# Patient Record
Sex: Female | Born: 1940 | Race: White | Hispanic: No | Marital: Married
Health system: Southern US, Community
[De-identification: ages and names within clinical notes are randomized; demographics above are authoritative.]

---

## 1999-04-07 ENCOUNTER — Emergency Department (HOSPITAL_COMMUNITY): Admission: EM | Admit: 1999-04-07 | Discharge: 1999-04-07 | Payer: Self-pay | Admitting: Emergency Medicine

## 1999-08-07 ENCOUNTER — Ambulatory Visit (HOSPITAL_COMMUNITY): Admission: RE | Admit: 1999-08-07 | Discharge: 1999-08-07 | Payer: Self-pay | Admitting: Neurosurgery

## 1999-08-16 ENCOUNTER — Ambulatory Visit (HOSPITAL_COMMUNITY): Admission: RE | Admit: 1999-08-16 | Discharge: 1999-08-16 | Payer: Self-pay | Admitting: Neurosurgery

## 1999-08-16 ENCOUNTER — Encounter: Payer: Self-pay | Admitting: Neurosurgery

## 2004-04-25 ENCOUNTER — Encounter: Payer: Self-pay | Admitting: Rheumatology

## 2004-05-23 ENCOUNTER — Encounter: Payer: Self-pay | Admitting: Rheumatology

## 2005-01-11 ENCOUNTER — Ambulatory Visit: Payer: Self-pay | Admitting: Internal Medicine

## 2005-01-22 ENCOUNTER — Ambulatory Visit: Payer: Self-pay | Admitting: Internal Medicine

## 2006-03-06 ENCOUNTER — Ambulatory Visit: Payer: Self-pay | Admitting: Gastroenterology

## 2006-06-10 ENCOUNTER — Ambulatory Visit: Payer: Self-pay | Admitting: Ophthalmology

## 2006-06-16 ENCOUNTER — Ambulatory Visit: Payer: Self-pay | Admitting: Ophthalmology

## 2006-07-14 ENCOUNTER — Ambulatory Visit: Payer: Self-pay | Admitting: Ophthalmology

## 2006-07-21 ENCOUNTER — Ambulatory Visit: Payer: Self-pay | Admitting: Ophthalmology

## 2006-10-20 ENCOUNTER — Encounter: Payer: Self-pay | Admitting: Unknown Physician Specialty

## 2007-06-24 ENCOUNTER — Ambulatory Visit: Payer: Self-pay | Admitting: Internal Medicine

## 2007-08-04 ENCOUNTER — Ambulatory Visit: Payer: Self-pay | Admitting: Unknown Physician Specialty

## 2008-08-22 ENCOUNTER — Ambulatory Visit: Payer: Self-pay | Admitting: Rheumatology

## 2008-11-07 ENCOUNTER — Ambulatory Visit: Payer: Self-pay | Admitting: Specialist

## 2008-12-20 ENCOUNTER — Encounter: Payer: Self-pay | Admitting: Rheumatology

## 2008-12-23 ENCOUNTER — Encounter: Payer: Self-pay | Admitting: Rheumatology

## 2009-12-16 ENCOUNTER — Ambulatory Visit: Payer: Self-pay | Admitting: Neurosurgery

## 2010-06-06 ENCOUNTER — Ambulatory Visit (HOSPITAL_COMMUNITY)
Admission: RE | Admit: 2010-06-06 | Discharge: 2010-06-06 | Disposition: A | Discharge status: Home / Self Care | Payer: Medicare Other | Source: Ambulatory Visit | Attending: Neurosurgery | Admitting: Neurosurgery

## 2010-06-06 ENCOUNTER — Other Ambulatory Visit (HOSPITAL_COMMUNITY): Payer: Self-pay | Admitting: Neurosurgery

## 2010-06-06 ENCOUNTER — Encounter (HOSPITAL_COMMUNITY)
Admission: RE | Admit: 2010-06-06 | Discharge: 2010-06-06 | Disposition: A | Discharge status: Home / Self Care | Payer: Medicare Other | Source: Ambulatory Visit | Attending: Neurosurgery | Admitting: Neurosurgery

## 2010-06-06 DIAGNOSIS — Z01812 Encounter for preprocedural laboratory examination: Secondary | ICD-10-CM | POA: Insufficient documentation

## 2010-06-06 DIAGNOSIS — M5126 Other intervertebral disc displacement, lumbar region: Secondary | ICD-10-CM

## 2010-06-06 DIAGNOSIS — Z01810 Encounter for preprocedural cardiovascular examination: Secondary | ICD-10-CM | POA: Insufficient documentation

## 2010-06-06 DIAGNOSIS — Z01818 Encounter for other preprocedural examination: Secondary | ICD-10-CM | POA: Insufficient documentation

## 2010-06-06 LAB — DIFFERENTIAL
Basophils Absolute: 0.1 10*3/uL (ref 0.0–0.1)
Basophils Relative: 1 % (ref 0–1)
Eosinophils Absolute: 0.2 10*3/uL (ref 0.0–0.7)
Eosinophils Relative: 2 % (ref 0–5)
Lymphocytes Relative: 30 % (ref 12–46)
Lymphs Abs: 2.6 10*3/uL (ref 0.7–4.0)
Monocytes Absolute: 0.8 10*3/uL (ref 0.1–1.0)
Monocytes Relative: 9 % (ref 3–12)
Neutro Abs: 5.1 10*3/uL (ref 1.7–7.7)
Neutrophils Relative %: 58 % (ref 43–77)

## 2010-06-06 LAB — CBC
HCT: 40.7 % (ref 36.0–46.0)
Hemoglobin: 14.8 g/dL (ref 12.0–15.0)
MCH: 36.9 pg — ABNORMAL HIGH (ref 26.0–34.0)
MCHC: 36.4 g/dL — ABNORMAL HIGH (ref 30.0–36.0)
MCV: 101.5 fL — ABNORMAL HIGH (ref 78.0–100.0)
Platelets: 244 10*3/uL (ref 150–400)
RBC: 4.01 MIL/uL (ref 3.87–5.11)
RDW: 13.8 % (ref 11.5–15.5)
WBC: 8.8 10*3/uL (ref 4.0–10.5)

## 2010-06-06 LAB — APTT: aPTT: 29 seconds (ref 24–37)

## 2010-06-06 LAB — COMPREHENSIVE METABOLIC PANEL
ALT: 27 U/L (ref 0–35)
AST: 46 U/L — ABNORMAL HIGH (ref 0–37)
Albumin: 4.6 g/dL (ref 3.5–5.2)
CO2: 24 mEq/L (ref 19–32)
Chloride: 97 mEq/L (ref 96–112)
Creatinine, Ser: 0.58 mg/dL (ref 0.4–1.2)
GFR calc Af Amer: >60 mL/min (ref >60)
GFR calc non Af Amer: >60 mL/min (ref >60)
Sodium: 134 mEq/L — ABNORMAL LOW (ref 135–145)
Total Bilirubin: 0.9 mg/dL (ref 0.3–1.2)

## 2010-06-06 LAB — PROTIME-INR
INR: 0.86 (ref 0.00–1.49)
Prothrombin Time: 11.9 seconds (ref 11.6–15.2)

## 2010-06-06 LAB — TYPE AND SCREEN
ABO/RH(D): O POS
Antibody Screen: NEGATIVE

## 2010-06-06 LAB — ABO/RH: ABO/RH(D): O POS

## 2010-06-13 ENCOUNTER — Ambulatory Visit (HOSPITAL_COMMUNITY)
Admission: RE | Admit: 2010-06-13 | Discharge: 2010-06-16 | Disposition: A | Discharge status: Home Health | Payer: Medicare Other | Source: Ambulatory Visit | Attending: Neurosurgery | Admitting: Neurosurgery

## 2010-06-13 ENCOUNTER — Inpatient Hospital Stay (HOSPITAL_COMMUNITY): Payer: Medicare Other

## 2010-06-13 DIAGNOSIS — Y9210 Unspecified residential institution as the place of occurrence of the external cause: Secondary | ICD-10-CM | POA: Insufficient documentation

## 2010-06-13 DIAGNOSIS — M79609 Pain in unspecified limb: Secondary | ICD-10-CM | POA: Insufficient documentation

## 2010-06-13 DIAGNOSIS — F17200 Nicotine dependence, unspecified, uncomplicated: Secondary | ICD-10-CM | POA: Insufficient documentation

## 2010-06-13 DIAGNOSIS — J449 Chronic obstructive pulmonary disease, unspecified: Secondary | ICD-10-CM | POA: Insufficient documentation

## 2010-06-13 DIAGNOSIS — Z01812 Encounter for preprocedural laboratory examination: Secondary | ICD-10-CM | POA: Insufficient documentation

## 2010-06-13 DIAGNOSIS — IMO0002 Reserved for concepts with insufficient information to code with codable children: Secondary | ICD-10-CM | POA: Insufficient documentation

## 2010-06-13 DIAGNOSIS — F1010 Alcohol abuse, uncomplicated: Secondary | ICD-10-CM | POA: Insufficient documentation

## 2010-06-13 DIAGNOSIS — M48061 Spinal stenosis, lumbar region without neurogenic claudication: Secondary | ICD-10-CM | POA: Insufficient documentation

## 2010-06-13 DIAGNOSIS — M5126 Other intervertebral disc displacement, lumbar region: Secondary | ICD-10-CM | POA: Insufficient documentation

## 2010-06-13 DIAGNOSIS — Y834 Other reconstructive surgery as the cause of abnormal reaction of the patient, or of later complication, without mention of misadventure at the time of the procedure: Secondary | ICD-10-CM | POA: Insufficient documentation

## 2010-06-13 DIAGNOSIS — J4489 Other specified chronic obstructive pulmonary disease: Secondary | ICD-10-CM | POA: Insufficient documentation

## 2010-06-13 DIAGNOSIS — G834 Cauda equina syndrome: Secondary | ICD-10-CM | POA: Insufficient documentation

## 2010-06-13 LAB — CBC
HCT: 33.4 % — ABNORMAL LOW (ref 36.0–46.0)
Platelets: 177 10*3/uL (ref 150–400)
RBC: 3.24 MIL/uL — ABNORMAL LOW (ref 3.87–5.11)
RDW: 13.7 % (ref 11.5–15.5)
WBC: 7.2 10*3/uL (ref 4.0–10.5)

## 2010-06-13 LAB — PROTIME-INR: INR: 0.96 (ref 0.00–1.49)

## 2010-06-13 LAB — APTT: aPTT: 27 seconds (ref 24–37)

## 2010-06-20 ENCOUNTER — Emergency Department: Payer: Self-pay | Admitting: Emergency Medicine

## 2010-06-20 NOTE — Op Note (Signed)
NAMEDIETRICH, KE              ACCOUNT NO.:  [id number]  MEDICAL RECORD NO.:  [id number]           PATIENT TYPE:  I  LOCATION:  3039                         FACILITY:  [facility]  PHYSICIAN:  Kathaleen Maser. Davidjames Blansett, M.D.    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  PROCEDURE NAME:  Bilateral L2-L3 decompressive laminotomies and foraminotomies.  Bilateral L3-L4 decompressive laminotomies and foraminotomies.  Right L3-4 microdiskectomy.  SURGEON:  Kathaleen Maser. Quadarius Henton, MD  ASSISTANT:  Tia Alert, MD  ANESTHESIA:  General endotracheal.  PREMEDICATION:  Ms. Lacomb is a 70 year old female with history of severe bilateral lower extremity pain and neurogenic claudication failing conservative management.  Workup demonstrates evidence of severe stenosis at L2-3 and L3-4 complicated by a large right paracentral disk herniation at L3-4.  The patient presents now for decompression and microdiskectomy in hopes of improving her symptoms.  OPERATIVE NOTE:  The patient was brought to the operating room and placed in the operating table in supine position.  After adequate level of anesthesia was achieved, the patient was placed on prone onto the Wilson frame and appropriately padded.  The patient's lumbar region was prepped and draped sterilely.  A 10-blade was used to make a curvilinear skin incision overlying the L2, 3, and 4 levels.  This was carried down sharply in the midline.  Subperiosteal dissection was then performed exposing the lamina and facet joints of L2, L3, and L4 bilaterally. Deep self-retaining retractor was placed.  Intraoperative x-ray was used and levels were confirmed.  bilateral laminotomy was then  performed using a high-speed drill and Kerrison rongeurs to remove the inferior aspect of the lamina of L2, medial aspect of the L2-3 facet joints, and superior rim of the L3 lamina.  Procedure was then repeated on the contralateral side and repeated in a similar fashion at L3-4 bilaterally.  Ligamentum flavum was then elevated and resected in piecemeal fashion using Kerrison rongeurs.  The underlying thecal sac and exiting L2, L3, and L4 nerve roots were identified and widely decompressed.  Microscope was then brought into the field.  Using microdissection of  thecal sac and underlying disk herniation on the right-sided L3-4, thecal sac and L4 nerve root were gently mobilized, tracked towards the midline.  Disk herniation was readily apparent. This was then incised with 15 blade in a rectangular fashion.  Wide disk space clean-out was then achieved using pituitary rongeurs, upbiting pituitary rongeurs, and Epstein curettes.  All elements of disk herniation were completely resected.  All loose or obvious degenerative disk material was then removed from the interspace.  At this point, a very thorough diskectomy had been achieved.  There was no injury to thecal sac or nerve roots.  The wound was then irrigated with antibiotic solution.  Gelfoam was placed topically for hemostasis and found to be good.  Microscope and retractor system were removed.  Hemostasis was achieved with  electrocautery.  The wound was closed in layers with Vicryl suture.  Steri-Strips and sterile dressings were applied.  There were no complications.  The patient tolerated the procedure well, and she returned to the recovery room postoperatively.          ______________________________ Kathaleen Maser Thamara Leger, M.D.     HAP/MEDQ  D:  06/13/2010  T:  06/14/2010  Job:  [id number]  Electronically Signed by Julio Sicks M.D. on 06/20/2010 04:06:30 PM

## 2010-06-20 NOTE — Op Note (Signed)
  Doris Blanchard, Doris Blanchard              ACCOUNT NO.:  [id number]  MEDICAL RECORD NO.:  [id number]           PATIENT TYPE:  I  LOCATION:  3039                         FACILITY:  [facility]  PHYSICIAN:  Kathaleen Maser. Jala Dundon, M.D.    DATE OF BIRTH:  [birth year]  DATE OF PROCEDURE:  06/13/2010 DATE OF DISCHARGE:                              OPERATIVE REPORT   PREOPERATIVE DIAGNOSIS:  Postoperative epidural hematoma with cauda equina syndrome.  POSTOPERATIVE DIAGNOSIS:  Postoperative epidural hematoma with cauda equina syndrome.  PROCEDURE NAME:  Emergent re-exploration of lumbar wound with evacuation of postoperative epidural hematoma.  SURGEON:  Kathaleen Maser. Benaiah Behan, MD  ANESTHESIA:  General endotracheal.  INDICATIONS:  Doris Blanchard is a 70 year old female who is a few hours status post L3-4 and L2-3 decompressive laminotomies with foraminotomies.  Postoperatively, the patient had been doing well when she progressively began to develop back pain with radiation to her left lower extremity with accompanying numbness and paresthesias.  This rapidly progressed to bilateral numbness and paresthesias as well as acute paraparesis.  The patient was evaluated and found to be likely suffering from acute postoperative epidural hematoma at least in part secondary to platelet dysfunction associated with her chronic alcoholism.  I discussed situation with the patient and her family. Recommended emergent return to the operating room for wound re- exploration.  Risks and benefits were discussed and the patient and her family agreed to proceed.  OPERATIVE NOTE:  The patient was taken to operating room where she was placed under general anesthesia.  She was turned prone onto Wilson frame and properly padded.  Wound was prepped and draped sterilely.  The wound was then reopened using 10 blade and suture scissors.  A large amount of hematoma was readily apparent in the soft tissues.  Retractor was placed.  Deep hematoma  was also evacuated.  There was no signs of any active major bleeding, just a general venous ooze throughout.  The wound was copiously irrigated.  The spinal canal was inspected.  There was no evidence of active hemorrhage.  There was no evidence of ventral epidural hematoma in either L2-3 or L3-4.  Medium large Hemovac drains were then left in place in the epidural space bilaterally.  These were sutured into place.  Wound was then reapproximated with Vicryl sutures and Steri-Strips and sterile dressing were applied to the surface.  There were no apparent complications.  The patient tolerated the procedure well and she returned to the recovery room postoperatively.          ______________________________ Kathaleen Maser Jazion Atteberry, M.D.     HAP/MEDQ  D:  06/13/2010  T:  06/14/2010  Job:  [id number]  Electronically Signed by Julio Sicks M.D. on 06/20/2010 04:06:32 PM

## 2010-08-20 ENCOUNTER — Ambulatory Visit: Payer: Self-pay | Admitting: Neurosurgery

## 2010-09-18 ENCOUNTER — Ambulatory Visit: Payer: Self-pay | Admitting: Internal Medicine

## 2011-01-21 ENCOUNTER — Ambulatory Visit: Payer: Self-pay | Admitting: Internal Medicine

## 2011-01-24 ENCOUNTER — Ambulatory Visit: Payer: Self-pay | Admitting: Internal Medicine

## 2011-07-24 ENCOUNTER — Ambulatory Visit: Payer: Self-pay | Admitting: Oncology

## 2011-08-05 ENCOUNTER — Ambulatory Visit: Payer: Self-pay | Admitting: Surgery

## 2011-08-08 ENCOUNTER — Ambulatory Visit: Payer: Self-pay | Admitting: Specialist

## 2011-08-08 LAB — URINALYSIS, COMPLETE
Bilirubin,UR: NEGATIVE
Blood: NEGATIVE
Ketone: NEGATIVE
Leukocyte Esterase: NEGATIVE
Protein: 30
WBC UR: 2 /HPF (ref 0–5)

## 2011-08-08 LAB — CBC
HCT: 38.1 % (ref 35.0–47.0)
MCH: 36.3 pg — ABNORMAL HIGH (ref 26.0–34.0)
MCHC: 33.9 g/dL (ref 32.0–36.0)
RBC: 3.55 10*6/uL — ABNORMAL LOW (ref 3.80–5.20)

## 2011-08-08 LAB — COMPREHENSIVE METABOLIC PANEL
Anion Gap: 13 (ref 7–16)
BUN: 7 mg/dL (ref 7–18)
Calcium, Total: 8.3 mg/dL — ABNORMAL LOW (ref 8.5–10.1)
Chloride: 97 mmol/L — ABNORMAL LOW (ref 98–107)
Creatinine: 0.7 mg/dL (ref 0.60–1.30)
Glucose: 87 mg/dL (ref 65–99)
Sodium: 137 mmol/L (ref 136–145)
Total Protein: 7.9 g/dL (ref 6.4–8.2)

## 2011-08-08 LAB — DRUG SCREEN, URINE
Benzodiazepine, Ur Scrn: NEGATIVE (ref ?–200)
Cocaine Metabolite,Ur ~~LOC~~: NEGATIVE (ref ?–300)
MDMA (Ecstasy)Ur Screen: NEGATIVE (ref ?–500)
Methadone, Ur Screen: NEGATIVE (ref ?–300)
Opiate, Ur Screen: NEGATIVE (ref ?–300)
Tricyclic, Ur Screen: NEGATIVE (ref ?–1000)

## 2011-08-08 LAB — TSH: Thyroid Stimulating Horm: 1.79 u[IU]/mL

## 2011-08-08 LAB — ACETAMINOPHEN LEVEL: Acetaminophen: <2 ug/mL

## 2011-08-09 ENCOUNTER — Inpatient Hospital Stay: Payer: Self-pay | Admitting: Internal Medicine

## 2011-08-09 LAB — FOLATE: Folic Acid: 20 ng/mL (ref 3.1–100.0)

## 2011-08-10 LAB — BASIC METABOLIC PANEL
BUN: 11 mg/dL (ref 7–18)
Calcium, Total: 8.5 mg/dL (ref 8.5–10.1)
EGFR (African American): >60
EGFR (Non-African Amer.): >60
Glucose: 163 mg/dL — ABNORMAL HIGH (ref 65–99)
Potassium: 3.9 mmol/L (ref 3.5–5.1)

## 2011-08-10 LAB — CBC WITH DIFFERENTIAL/PLATELET
Basophil #: 0 10*3/uL (ref 0.0–0.1)
Basophil %: 0.5 %
Eosinophil #: 0 10*3/uL (ref 0.0–0.7)
Eosinophil %: 0 %
HCT: 34.5 % — ABNORMAL LOW (ref 35.0–47.0)
Lymphocyte #: 0.8 10*3/uL — ABNORMAL LOW (ref 1.0–3.6)
Lymphocyte %: 17.1 %
MCH: 36.2 pg — ABNORMAL HIGH (ref 26.0–34.0)
MCHC: 33.8 g/dL (ref 32.0–36.0)
Monocyte #: 0.7 x10 3/mm (ref 0.2–0.9)
Neutrophil %: 66.4 %
Platelet: 247 10*3/uL (ref 150–440)
WBC: 4.6 10*3/uL (ref 3.6–11.0)

## 2011-08-12 LAB — BASIC METABOLIC PANEL
Chloride: 97 mmol/L — ABNORMAL LOW (ref 98–107)
Co2: 32 mmol/L (ref 21–32)
Creatinine: 0.6 mg/dL (ref 0.60–1.30)
EGFR (African American): >60
EGFR (Non-African Amer.): >60
Osmolality: 275 (ref 275–301)

## 2011-08-12 LAB — CBC WITH DIFFERENTIAL/PLATELET
Basophil #: 0 10*3/uL (ref 0.0–0.1)
Eosinophil %: 2.1 %
HCT: 37.5 % (ref 35.0–47.0)
Lymphocyte #: 1.5 10*3/uL (ref 1.0–3.6)
MCH: 36.4 pg — ABNORMAL HIGH (ref 26.0–34.0)
MCV: 107 fL — ABNORMAL HIGH (ref 80–100)
WBC: 5.5 10*3/uL (ref 3.6–11.0)

## 2011-08-13 LAB — HEPATIC FUNCTION PANEL A (ARMC)
Alkaline Phosphatase: 63 U/L (ref 50–136)
Bilirubin, Direct: <0.1 mg/dL (ref 0.00–0.20)
Bilirubin,Total: 0.3 mg/dL (ref 0.2–1.0)

## 2011-08-14 LAB — CANCER ANTIGEN 27.29: CA 27.29: 31.2 U/mL (ref 0.0–38.6)

## 2011-08-14 LAB — CULTURE, BLOOD (SINGLE)

## 2011-08-24 ENCOUNTER — Ambulatory Visit: Payer: Self-pay | Admitting: Oncology

## 2011-12-16 ENCOUNTER — Ambulatory Visit: Payer: Self-pay | Admitting: Specialist

## 2011-12-24 ENCOUNTER — Inpatient Hospital Stay: Payer: Self-pay | Admitting: Psychiatry

## 2011-12-24 LAB — COMPREHENSIVE METABOLIC PANEL WITH GFR
Albumin: 3.1 g/dL — ABNORMAL LOW
Alkaline Phosphatase: 126 U/L
Anion Gap: 13
BUN: 5 mg/dL — ABNORMAL LOW
Bilirubin,Total: 0.4 mg/dL
Calcium, Total: 7.4 mg/dL — ABNORMAL LOW
Chloride: 96 mmol/L — ABNORMAL LOW
Co2: 27 mmol/L
Creatinine: 0.72 mg/dL
EGFR (African American): >60
EGFR (Non-African Amer.): >60
Glucose: 99 mg/dL
Osmolality: 269
Potassium: 3.6 mmol/L
SGOT(AST): 103 U/L — ABNORMAL HIGH
SGPT (ALT): 60 U/L
Sodium: 136 mmol/L
Total Protein: 6.7 g/dL

## 2011-12-24 LAB — DRUG SCREEN, URINE
Amphetamines, Ur Screen: NEGATIVE
Barbiturates, Ur Screen: NEGATIVE
Benzodiazepine, Ur Scrn: NEGATIVE
Cannabinoid 50 Ng, Ur ~~LOC~~: NEGATIVE
Cocaine Metabolite,Ur ~~LOC~~: NEGATIVE
MDMA (Ecstasy)Ur Screen: NEGATIVE
Methadone, Ur Screen: NEGATIVE
Opiate, Ur Screen: NEGATIVE
Phencyclidine (PCP) Ur S: NEGATIVE
Tricyclic, Ur Screen: NEGATIVE

## 2011-12-24 LAB — ACETAMINOPHEN LEVEL: Acetaminophen: <2 ug/mL

## 2011-12-24 LAB — SALICYLATE LEVEL: Salicylates, Serum: <1.7 mg/dL

## 2011-12-24 LAB — URINALYSIS, COMPLETE
Bacteria: NONE SEEN
Bilirubin,UR: NEGATIVE
Ph: 6 (ref 4.5–8.0)
RBC,UR: <1 /HPF (ref 0–5)
WBC UR: <1 /HPF (ref 0–5)

## 2011-12-24 LAB — ETHANOL
Ethanol %: 0.189 % — ABNORMAL HIGH (ref 0.000–0.080)
Ethanol: 189 mg/dL

## 2011-12-24 LAB — CBC
HCT: 34.5 % — ABNORMAL LOW
HGB: 11.7 g/dL — ABNORMAL LOW
MCH: 33.8 pg
MCHC: 33.7 g/dL
MCV: 100 fL
Platelet: 224 10*3/uL
RBC: 3.45 X10 6/mm 3 — ABNORMAL LOW
RDW: 14.9 % — ABNORMAL HIGH
WBC: 4.3 10*3/uL

## 2011-12-24 LAB — TSH: Thyroid Stimulating Horm: 2.05 u[IU]/mL

## 2011-12-26 LAB — COMPREHENSIVE METABOLIC PANEL
Alkaline Phosphatase: 106 U/L (ref 50–136)
Anion Gap: 8 (ref 7–16)
Calcium, Total: 8 mg/dL — ABNORMAL LOW (ref 8.5–10.1)
Chloride: 95 mmol/L — ABNORMAL LOW (ref 98–107)
Co2: 30 mmol/L (ref 21–32)
EGFR (African American): >60
EGFR (Non-African Amer.): >60
Glucose: 116 mg/dL — ABNORMAL HIGH (ref 65–99)
Osmolality: 265 (ref 275–301)
Potassium: 4.2 mmol/L (ref 3.5–5.1)

## 2012-01-22 ENCOUNTER — Ambulatory Visit: Payer: Self-pay | Admitting: Surgery

## 2012-01-31 ENCOUNTER — Ambulatory Visit: Payer: Self-pay | Admitting: Rheumatology

## 2012-04-24 ENCOUNTER — Ambulatory Visit: Payer: Self-pay | Admitting: Unknown Physician Specialty

## 2012-05-03 ENCOUNTER — Inpatient Hospital Stay: Payer: Self-pay | Admitting: Internal Medicine

## 2012-05-03 LAB — COMPREHENSIVE METABOLIC PANEL
Albumin: 3.7 g/dL (ref 3.4–5.0)
Alkaline Phosphatase: 155 U/L — ABNORMAL HIGH (ref 50–136)
Bilirubin,Total: 1 mg/dL (ref 0.2–1.0)
Calcium, Total: 8.6 mg/dL (ref 8.5–10.1)
Chloride: 94 mmol/L — ABNORMAL LOW (ref 98–107)
Co2: 25 mmol/L (ref 21–32)
EGFR (Non-African Amer.): >60
Glucose: 73 mg/dL (ref 65–99)
Osmolality: 264 (ref 275–301)
Potassium: 3.7 mmol/L (ref 3.5–5.1)
SGOT(AST): 459 U/L — ABNORMAL HIGH (ref 15–37)
SGPT (ALT): 213 U/L — ABNORMAL HIGH (ref 12–78)
Sodium: 134 mmol/L — ABNORMAL LOW (ref 136–145)
Total Protein: 7.3 g/dL (ref 6.4–8.2)

## 2012-05-03 LAB — URINALYSIS, COMPLETE
Blood: NEGATIVE
Glucose,UR: NEGATIVE mg/dL (ref 0–75)
Nitrite: NEGATIVE
Ph: 6 (ref 4.5–8.0)
RBC,UR: 4 /HPF (ref 0–5)
WBC UR: 19 /HPF (ref 0–5)

## 2012-05-03 LAB — CBC
HGB: 10.3 g/dL — ABNORMAL LOW (ref 12.0–16.0)
MCH: 34.2 pg — ABNORMAL HIGH (ref 26.0–34.0)
Platelet: 181 10*3/uL (ref 150–440)
RDW: 17.9 % — ABNORMAL HIGH (ref 11.5–14.5)
WBC: 3.7 10*3/uL (ref 3.6–11.0)

## 2012-05-03 LAB — CK TOTAL AND CKMB (NOT AT ARMC): CK, Total: 51 U/L (ref 21–215)

## 2012-05-03 LAB — TROPONIN I: Troponin-I: <0.02 ng/mL

## 2012-05-04 LAB — LIPASE, BLOOD: Lipase: 680 U/L — ABNORMAL HIGH (ref 73–393)

## 2012-05-05 LAB — CBC WITH DIFFERENTIAL/PLATELET
Basophil #: 0.1 10*3/uL (ref 0.0–0.1)
Basophil %: 1.3 %
Eosinophil #: 0 10*3/uL (ref 0.0–0.7)
Eosinophil %: 0.6 %
HCT: 27.5 % — ABNORMAL LOW (ref 35.0–47.0)
MCHC: 34.1 g/dL (ref 32.0–36.0)
MCV: 104 fL — ABNORMAL HIGH (ref 80–100)
Monocyte #: 0.3 x10 3/mm (ref 0.2–0.9)
Monocyte %: 7.7 %
Neutrophil #: 3 10*3/uL (ref 1.4–6.5)
Platelet: 143 10*3/uL — ABNORMAL LOW (ref 150–440)
RDW: 17 % — ABNORMAL HIGH (ref 11.5–14.5)

## 2012-05-05 LAB — BASIC METABOLIC PANEL
Anion Gap: 16 (ref 7–16)
BUN: 3 mg/dL — ABNORMAL LOW (ref 7–18)
Calcium, Total: 7.5 mg/dL — ABNORMAL LOW (ref 8.5–10.1)
Chloride: 93 mmol/L — ABNORMAL LOW (ref 98–107)
Co2: 23 mmol/L (ref 21–32)
Glucose: 139 mg/dL — ABNORMAL HIGH (ref 65–99)
Osmolality: 263 (ref 275–301)
Sodium: 132 mmol/L — ABNORMAL LOW (ref 136–145)

## 2012-05-05 LAB — HEPATIC FUNCTION PANEL A (ARMC)
Alkaline Phosphatase: 138 U/L — ABNORMAL HIGH (ref 50–136)
Bilirubin, Direct: 0.7 mg/dL — ABNORMAL HIGH (ref 0.00–0.20)
Bilirubin,Total: 1.2 mg/dL — ABNORMAL HIGH (ref 0.2–1.0)
SGOT(AST): 281 U/L — ABNORMAL HIGH (ref 15–37)
SGPT (ALT): 156 U/L — ABNORMAL HIGH (ref 12–78)
Total Protein: 6.5 g/dL (ref 6.4–8.2)

## 2012-05-05 LAB — LIPASE, BLOOD: Lipase: 407 U/L — ABNORMAL HIGH (ref 73–393)

## 2012-05-06 LAB — CBC WITH DIFFERENTIAL/PLATELET
Basophil #: 0 10*3/uL (ref 0.0–0.1)
Eosinophil #: 0 10*3/uL (ref 0.0–0.7)
Eosinophil %: 0.1 %
HGB: 9.1 g/dL — ABNORMAL LOW (ref 12.0–16.0)
MCHC: 32.1 g/dL (ref 32.0–36.0)
Neutrophil #: 2.7 10*3/uL (ref 1.4–6.5)
Platelet: 165 10*3/uL (ref 150–440)
RBC: 2.72 10*6/uL — ABNORMAL LOW (ref 3.80–5.20)
WBC: 3.6 10*3/uL (ref 3.6–11.0)

## 2012-05-06 LAB — BASIC METABOLIC PANEL
Anion Gap: 13 (ref 7–16)
BUN: 5 mg/dL — ABNORMAL LOW (ref 7–18)
Chloride: 90 mmol/L — ABNORMAL LOW (ref 98–107)
Co2: 25 mmol/L (ref 21–32)
EGFR (African American): >60
Osmolality: 256 (ref 275–301)
Potassium: 3.7 mmol/L (ref 3.5–5.1)
Sodium: 128 mmol/L — ABNORMAL LOW (ref 136–145)

## 2012-05-06 LAB — HEPATIC FUNCTION PANEL A (ARMC)
Albumin: 3.3 g/dL — ABNORMAL LOW (ref 3.4–5.0)
Alkaline Phosphatase: 139 U/L — ABNORMAL HIGH (ref 50–136)
SGOT(AST): 280 U/L — ABNORMAL HIGH (ref 15–37)
Total Protein: 6.9 g/dL (ref 6.4–8.2)

## 2012-05-07 LAB — CBC WITH DIFFERENTIAL/PLATELET
Basophil #: 0 10*3/uL (ref 0.0–0.1)
Eosinophil #: 0 10*3/uL (ref 0.0–0.7)
HCT: 27.6 % — ABNORMAL LOW (ref 35.0–47.0)
HGB: 9.1 g/dL — ABNORMAL LOW (ref 12.0–16.0)
Lymphocyte #: 0.4 10*3/uL — ABNORMAL LOW (ref 1.0–3.6)
MCH: 34.2 pg — ABNORMAL HIGH (ref 26.0–34.0)
Monocyte %: 13.4 %
Neutrophil #: 2.6 10*3/uL (ref 1.4–6.5)
Neutrophil %: 73.7 %
Platelet: 198 10*3/uL (ref 150–440)
RBC: 2.66 10*6/uL — ABNORMAL LOW (ref 3.80–5.20)
WBC: 3.5 10*3/uL — ABNORMAL LOW (ref 3.6–11.0)

## 2012-05-07 LAB — BASIC METABOLIC PANEL
Anion Gap: 14 (ref 7–16)
Co2: 25 mmol/L (ref 21–32)

## 2012-05-08 LAB — CBC WITH DIFFERENTIAL/PLATELET
Basophil %: 0.2 %
Eosinophil #: 0 10*3/uL (ref 0.0–0.7)
Eosinophil %: 0 %
HCT: 28 % — ABNORMAL LOW (ref 35.0–47.0)
Lymphocyte #: 0.4 10*3/uL — ABNORMAL LOW (ref 1.0–3.6)
MCV: 103 fL — ABNORMAL HIGH (ref 80–100)
Monocyte #: 0.7 x10 3/mm (ref 0.2–0.9)
Monocyte %: 17.4 %
Neutrophil #: 2.9 10*3/uL (ref 1.4–6.5)
Neutrophil %: 71.4 %
RDW: 17.1 % — ABNORMAL HIGH (ref 11.5–14.5)
WBC: 4 10*3/uL (ref 3.6–11.0)

## 2012-05-08 LAB — BASIC METABOLIC PANEL
Calcium, Total: 8.1 mg/dL — ABNORMAL LOW (ref 8.5–10.1)
Co2: 25 mmol/L (ref 21–32)
EGFR (African American): >60
Osmolality: 260 (ref 275–301)

## 2012-05-08 LAB — HEPATIC FUNCTION PANEL A (ARMC)
Albumin: 3.3 g/dL — ABNORMAL LOW (ref 3.4–5.0)
Bilirubin, Direct: 0.8 mg/dL — ABNORMAL HIGH (ref 0.00–0.20)
Bilirubin,Total: 1.3 mg/dL — ABNORMAL HIGH (ref 0.2–1.0)
SGOT(AST): 170 U/L — ABNORMAL HIGH (ref 15–37)
SGPT (ALT): 139 U/L — ABNORMAL HIGH (ref 12–78)

## 2012-05-09 LAB — BASIC METABOLIC PANEL
Anion Gap: 11 (ref 7–16)
Chloride: 95 mmol/L — ABNORMAL LOW (ref 98–107)
Creatinine: 0.47 mg/dL — ABNORMAL LOW (ref 0.60–1.30)
Osmolality: 264 (ref 275–301)
Potassium: 3.3 mmol/L — ABNORMAL LOW (ref 3.5–5.1)
Sodium: 131 mmol/L — ABNORMAL LOW (ref 136–145)

## 2012-05-09 LAB — CBC WITH DIFFERENTIAL/PLATELET
Basophil #: 0 10*3/uL (ref 0.0–0.1)
Basophil %: 0 %
Eosinophil #: 0 10*3/uL (ref 0.0–0.7)
HCT: 26.9 % — ABNORMAL LOW (ref 35.0–47.0)
HGB: 9 g/dL — ABNORMAL LOW (ref 12.0–16.0)
MCH: 34.5 pg — ABNORMAL HIGH (ref 26.0–34.0)
Monocyte %: 19.6 %
Neutrophil #: 2.4 10*3/uL (ref 1.4–6.5)
Platelet: 250 10*3/uL (ref 150–440)
RBC: 2.62 10*6/uL — ABNORMAL LOW (ref 3.80–5.20)
RDW: 17.6 % — ABNORMAL HIGH (ref 11.5–14.5)

## 2012-05-09 LAB — MAGNESIUM: Magnesium: 1.1 mg/dL — ABNORMAL LOW

## 2012-05-10 LAB — CBC WITH DIFFERENTIAL/PLATELET
Basophil %: 0.2 %
Eosinophil #: 0 10*3/uL (ref 0.0–0.7)
Eosinophil %: 0.2 %
HCT: 26.7 % — ABNORMAL LOW (ref 35.0–47.0)
HGB: 8.5 g/dL — ABNORMAL LOW (ref 12.0–16.0)
Lymphocyte #: 0.9 10*3/uL — ABNORMAL LOW (ref 1.0–3.6)
Lymphocyte %: 21.1 %
MCHC: 31.8 g/dL — ABNORMAL LOW (ref 32.0–36.0)
MCV: 102 fL — ABNORMAL HIGH (ref 80–100)
Monocyte #: 1 x10 3/mm — ABNORMAL HIGH (ref 0.2–0.9)
Monocyte %: 23.2 %
Neutrophil %: 55.3 %
RDW: 17.9 % — ABNORMAL HIGH (ref 11.5–14.5)

## 2012-05-10 LAB — BASIC METABOLIC PANEL
BUN: 9 mg/dL (ref 7–18)
Calcium, Total: 7.7 mg/dL — ABNORMAL LOW (ref 8.5–10.1)
Chloride: 95 mmol/L — ABNORMAL LOW (ref 98–107)
Co2: 27 mmol/L (ref 21–32)
Creatinine: 0.63 mg/dL (ref 0.60–1.30)
EGFR (African American): >60
Osmolality: 260 (ref 275–301)
Sodium: 130 mmol/L — ABNORMAL LOW (ref 136–145)

## 2012-05-10 LAB — MAGNESIUM: Magnesium: 1.9 mg/dL

## 2012-05-11 LAB — BASIC METABOLIC PANEL
Anion Gap: 11 (ref 7–16)
BUN: 9 mg/dL (ref 7–18)
Creatinine: 0.58 mg/dL — ABNORMAL LOW (ref 0.60–1.30)
EGFR (African American): >60
Glucose: 90 mg/dL (ref 65–99)
Osmolality: 253 (ref 275–301)
Sodium: 127 mmol/L — ABNORMAL LOW (ref 136–145)

## 2012-05-11 LAB — CBC WITH DIFFERENTIAL/PLATELET
Basophil %: 0.1 %
Eosinophil #: 0 10*3/uL (ref 0.0–0.7)
HCT: 29.6 % — ABNORMAL LOW (ref 35.0–47.0)
Lymphocyte #: 1.2 10*3/uL (ref 1.0–3.6)
Lymphocyte %: 24.7 %
MCHC: 33 g/dL (ref 32.0–36.0)
Monocyte #: 1 x10 3/mm — ABNORMAL HIGH (ref 0.2–0.9)
Neutrophil %: 53.9 %
Platelet: 346 10*3/uL (ref 150–440)
RBC: 2.91 10*6/uL — ABNORMAL LOW (ref 3.80–5.20)
RDW: 17.9 % — ABNORMAL HIGH (ref 11.5–14.5)

## 2012-05-11 LAB — HEPATIC FUNCTION PANEL A (ARMC)
Albumin: 3.3 g/dL — ABNORMAL LOW (ref 3.4–5.0)
Alkaline Phosphatase: 114 U/L (ref 50–136)
Bilirubin, Direct: 0.6 mg/dL — ABNORMAL HIGH (ref 0.00–0.20)
Bilirubin,Total: 1.2 mg/dL — ABNORMAL HIGH (ref 0.2–1.0)
Total Protein: 6.4 g/dL (ref 6.4–8.2)

## 2012-05-13 LAB — CBC WITH DIFFERENTIAL/PLATELET
Basophil #: 0 10*3/uL (ref 0.0–0.1)
Basophil %: 0.1 %
Eosinophil %: 0.9 %
HCT: 29.6 % — ABNORMAL LOW (ref 35.0–47.0)
HGB: 9.9 g/dL — ABNORMAL LOW (ref 12.0–16.0)
Lymphocyte #: 1.3 10*3/uL (ref 1.0–3.6)
Lymphocyte %: 24.7 %
MCH: 33.3 pg (ref 26.0–34.0)
Monocyte #: 1.1 x10 3/mm — ABNORMAL HIGH (ref 0.2–0.9)
Monocyte %: 21.7 %
Platelet: 395 10*3/uL (ref 150–440)
RBC: 2.96 10*6/uL — ABNORMAL LOW (ref 3.80–5.20)
RDW: 18.5 % — ABNORMAL HIGH (ref 11.5–14.5)
WBC: 5.2 10*3/uL (ref 3.6–11.0)

## 2012-05-13 LAB — BASIC METABOLIC PANEL WITH GFR
Anion Gap: 10 (ref 7–16)
BUN: 12 mg/dL (ref 7–18)
Calcium, Total: 9.4 mg/dL (ref 8.5–10.1)
Chloride: 88 mmol/L — ABNORMAL LOW (ref 98–107)
Co2: 25 mmol/L (ref 21–32)
Creatinine: 0.73 mg/dL (ref 0.60–1.30)
EGFR (African American): >60
EGFR (Non-African Amer.): >60
Glucose: 139 mg/dL — ABNORMAL HIGH (ref 65–99)
Osmolality: 250 (ref 275–301)
Potassium: 4.2 mmol/L (ref 3.5–5.1)
Sodium: 123 mmol/L — ABNORMAL LOW (ref 136–145)

## 2012-05-13 LAB — HEPATIC FUNCTION PANEL A (ARMC)
Albumin: 3.2 g/dL — ABNORMAL LOW (ref 3.4–5.0)
Bilirubin, Direct: 0.4 mg/dL — ABNORMAL HIGH (ref 0.00–0.20)
Bilirubin,Total: 1.1 mg/dL — ABNORMAL HIGH (ref 0.2–1.0)
Total Protein: 6.3 g/dL — ABNORMAL LOW (ref 6.4–8.2)

## 2012-05-13 LAB — BASIC METABOLIC PANEL
Anion Gap: 12 (ref 7–16)
Calcium, Total: 8.9 mg/dL (ref 8.5–10.1)
Chloride: 87 mmol/L — ABNORMAL LOW (ref 98–107)
Co2: 25 mmol/L (ref 21–32)
Glucose: 86 mg/dL (ref 65–99)
Osmolality: 248 (ref 275–301)
Potassium: 3.7 mmol/L (ref 3.5–5.1)
Sodium: 124 mmol/L — ABNORMAL LOW (ref 136–145)

## 2012-05-14 LAB — CBC WITH DIFFERENTIAL/PLATELET
Basophil #: 0 10*3/uL (ref 0.0–0.1)
Basophil %: 0.3 %
Eosinophil %: 0.7 %
Lymphocyte #: 1.4 10*3/uL (ref 1.0–3.6)
Lymphocyte %: 23.2 %
MCH: 33 pg (ref 26.0–34.0)
Neutrophil #: 3.4 10*3/uL (ref 1.4–6.5)
Neutrophil %: 56.5 %
RBC: 2.89 10*6/uL — ABNORMAL LOW (ref 3.80–5.20)
RDW: 18.6 % — ABNORMAL HIGH (ref 11.5–14.5)
WBC: 6.1 10*3/uL (ref 3.6–11.0)

## 2012-05-14 LAB — BASIC METABOLIC PANEL
Calcium, Total: 9.1 mg/dL (ref 8.5–10.1)
Co2: 23 mmol/L (ref 21–32)
EGFR (African American): >60
EGFR (Non-African Amer.): >60
Glucose: 83 mg/dL (ref 65–99)
Osmolality: 253 (ref 275–301)
Potassium: 3.8 mmol/L (ref 3.5–5.1)
Sodium: 126 mmol/L — ABNORMAL LOW (ref 136–145)

## 2012-05-14 LAB — OSMOLALITY: Osmolality: 262 mOsm/kg — ABNORMAL LOW (ref 280–301)

## 2012-05-14 LAB — OSMOLALITY, URINE: Osmolality: 220 mOsm/kg

## 2012-05-15 LAB — CBC WITH DIFFERENTIAL/PLATELET
Basophil %: 0.3 %
Eosinophil #: 0 10*3/uL (ref 0.0–0.7)
Eosinophil %: 0.7 %
HCT: 27.4 % — ABNORMAL LOW (ref 35.0–47.0)
HGB: 9.1 g/dL — ABNORMAL LOW (ref 12.0–16.0)
Lymphocyte #: 1.4 10*3/uL (ref 1.0–3.6)
MCH: 33.3 pg (ref 26.0–34.0)
MCHC: 33.2 g/dL (ref 32.0–36.0)
MCV: 100 fL (ref 80–100)
Monocyte %: 16 %
Neutrophil #: 4.3 10*3/uL (ref 1.4–6.5)
Platelet: 366 10*3/uL (ref 150–440)
RBC: 2.73 10*6/uL — ABNORMAL LOW (ref 3.80–5.20)

## 2012-05-15 LAB — BASIC METABOLIC PANEL
Anion Gap: 8 (ref 7–16)
BUN: 14 mg/dL (ref 7–18)
Calcium, Total: 9 mg/dL (ref 8.5–10.1)
Co2: 24 mmol/L (ref 21–32)
Creatinine: 0.79 mg/dL (ref 0.60–1.30)
EGFR (African American): >60
EGFR (Non-African Amer.): >60
Sodium: 129 mmol/L — ABNORMAL LOW (ref 136–145)

## 2012-05-16 LAB — CBC WITH DIFFERENTIAL/PLATELET
Basophil #: 0 10*3/uL (ref 0.0–0.1)
Basophil %: 0.4 %
Eosinophil #: 0.1 10*3/uL (ref 0.0–0.7)
Eosinophil %: 1.1 %
HGB: 9.2 g/dL — ABNORMAL LOW (ref 12.0–16.0)
Lymphocyte %: 22.7 %
MCH: 35.7 pg — ABNORMAL HIGH (ref 26.0–34.0)
Monocyte #: 0.9 x10 3/mm (ref 0.2–0.9)
Monocyte %: 11.9 %
Neutrophil #: 4.8 10*3/uL (ref 1.4–6.5)
Platelet: 338 10*3/uL (ref 150–440)
RDW: 18.1 % — ABNORMAL HIGH (ref 11.5–14.5)
WBC: 7.5 10*3/uL (ref 3.6–11.0)

## 2012-05-16 LAB — BASIC METABOLIC PANEL
Anion Gap: 11 (ref 7–16)
Calcium, Total: 8.6 mg/dL (ref 8.5–10.1)
Chloride: 98 mmol/L (ref 98–107)
Co2: 24 mmol/L (ref 21–32)
Creatinine: 0.77 mg/dL (ref 0.60–1.30)
EGFR (Non-African Amer.): >60
Sodium: 133 mmol/L — ABNORMAL LOW (ref 136–145)

## 2012-05-16 LAB — HEPATIC FUNCTION PANEL A (ARMC)
Albumin: 2.8 g/dL — ABNORMAL LOW (ref 3.4–5.0)
Alkaline Phosphatase: 94 U/L (ref 50–136)
Bilirubin,Total: 0.6 mg/dL (ref 0.2–1.0)
SGPT (ALT): 91 U/L — ABNORMAL HIGH (ref 12–78)
Total Protein: 5.5 g/dL — ABNORMAL LOW (ref 6.4–8.2)

## 2012-05-17 LAB — COMPREHENSIVE METABOLIC PANEL
Alkaline Phosphatase: 105 U/L (ref 50–136)
Bilirubin,Total: 0.5 mg/dL (ref 0.2–1.0)
Chloride: 93 mmol/L — ABNORMAL LOW (ref 98–107)
Co2: 24 mmol/L (ref 21–32)
EGFR (African American): >60
EGFR (Non-African Amer.): >60
Glucose: 107 mg/dL — ABNORMAL HIGH (ref 65–99)
Osmolality: 258 (ref 275–301)
Potassium: 3.8 mmol/L (ref 3.5–5.1)
Sodium: 128 mmol/L — ABNORMAL LOW (ref 136–145)

## 2012-05-17 LAB — CBC WITH DIFFERENTIAL/PLATELET
Basophil %: 0.6 %
Eosinophil #: 0.1 10*3/uL (ref 0.0–0.7)
Eosinophil %: 1.1 %
HCT: 27.2 % — ABNORMAL LOW (ref 35.0–47.0)
MCH: 33.2 pg (ref 26.0–34.0)
MCHC: 33.1 g/dL (ref 32.0–36.0)
Monocyte #: 0.8 x10 3/mm (ref 0.2–0.9)
Monocyte %: 9.7 %
Neutrophil #: 5.8 10*3/uL (ref 1.4–6.5)
Neutrophil %: 69.2 %
Platelet: 327 10*3/uL (ref 150–440)
WBC: 8.3 10*3/uL (ref 3.6–11.0)

## 2012-05-17 LAB — LIPASE, BLOOD: Lipase: 246 U/L (ref 73–393)

## 2012-05-18 LAB — CBC WITH DIFFERENTIAL/PLATELET
Basophil %: 0.7 %
Eosinophil #: 0.1 10*3/uL (ref 0.0–0.7)
Eosinophil %: 0.9 %
Lymphocyte %: 22.1 %
MCH: 32.7 pg (ref 26.0–34.0)
Monocyte #: 0.9 x10 3/mm (ref 0.2–0.9)
Monocyte %: 10 %
Neutrophil #: 6.1 10*3/uL (ref 1.4–6.5)
Neutrophil %: 66.3 %
Platelet: 323 10*3/uL (ref 150–440)
RBC: 2.76 10*6/uL — ABNORMAL LOW (ref 3.80–5.20)
RDW: 18.7 % — ABNORMAL HIGH (ref 11.5–14.5)

## 2012-05-18 LAB — COMPREHENSIVE METABOLIC PANEL
Alkaline Phosphatase: 98 U/L (ref 50–136)
Anion Gap: 9 (ref 7–16)
BUN: 11 mg/dL (ref 7–18)
Creatinine: 0.67 mg/dL (ref 0.60–1.30)
EGFR (Non-African Amer.): >60
SGPT (ALT): 80 U/L — ABNORMAL HIGH (ref 12–78)
Sodium: 129 mmol/L — ABNORMAL LOW (ref 136–145)
Total Protein: 5.7 g/dL — ABNORMAL LOW (ref 6.4–8.2)

## 2012-08-10 ENCOUNTER — Ambulatory Visit: Payer: Self-pay | Admitting: Surgery

## 2012-08-11 ENCOUNTER — Ambulatory Visit: Payer: Self-pay

## 2013-05-14 IMAGING — CT CT CHEST W/ CM
1 series · 14 of 32 positions shown, 18 images · non-contrast
Comparison: none

REASON FOR EXAM: FU pulmonary nodules
COMMENTS:

[Series 3: soft tissue · axial · 0.65mm/px · z∈[-630,-336]mm · 14 of 116 slices shown, 18 images]
[im 9/116  mediastinal]
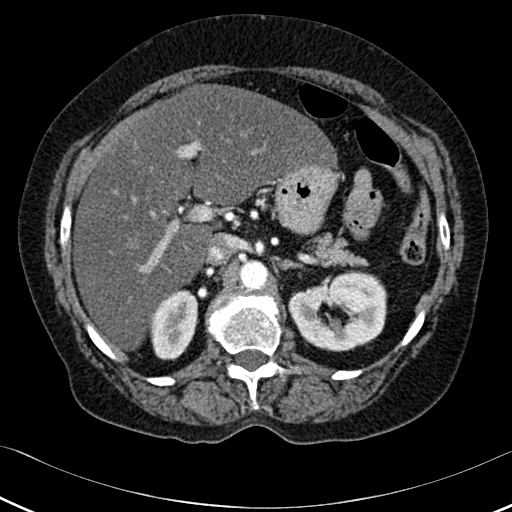
[im 9/116  lung]
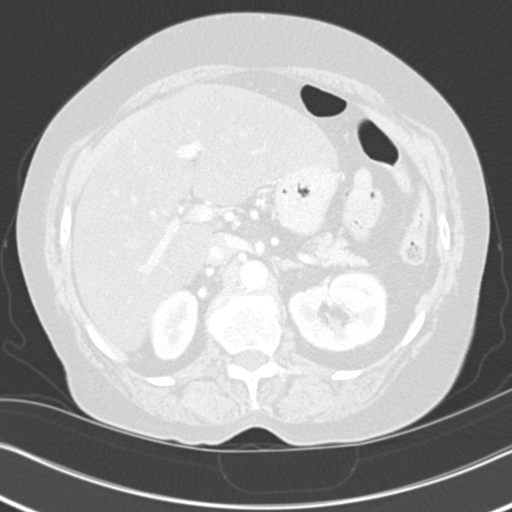
[im 18/116  lung]
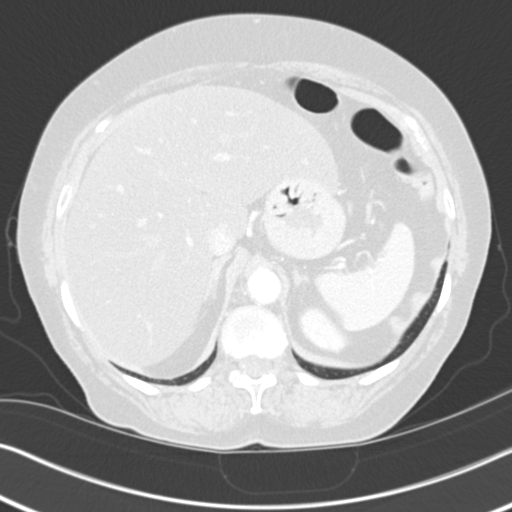
[im 24/116  lung]
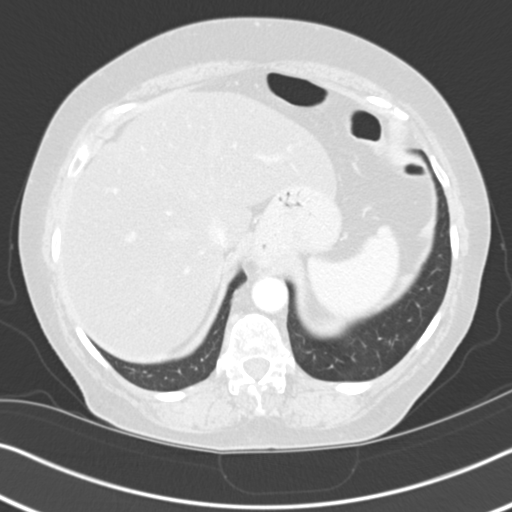
[im 30/116  lung]
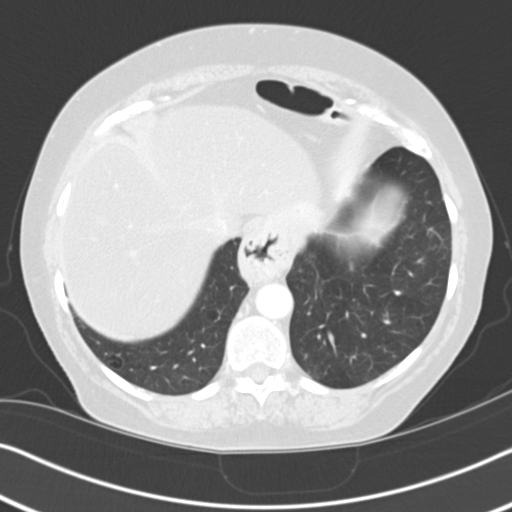
[im 39/116  mediastinal]
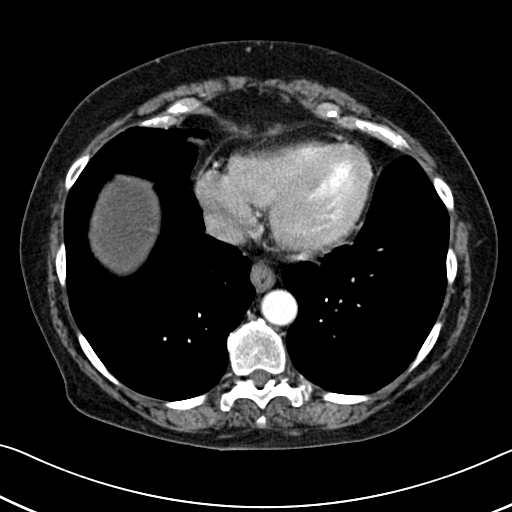
[im 39/116  lung]
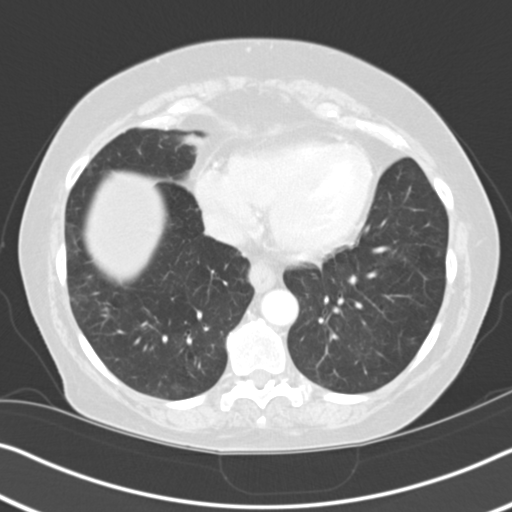
[im 47/116  lung]
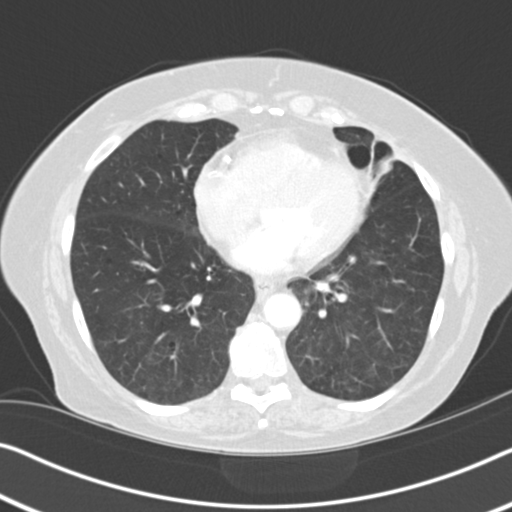
[im 56/116  lung]
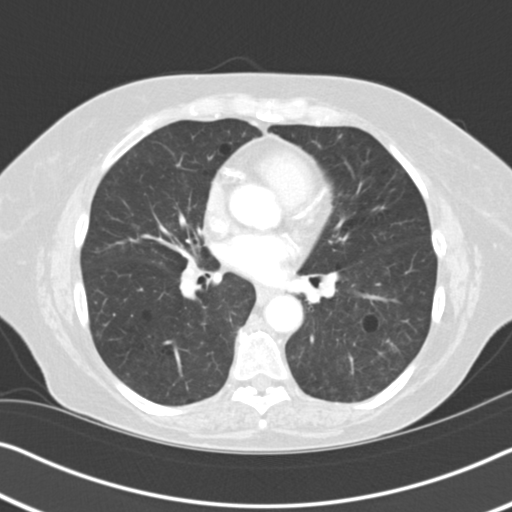
[im 64/116  lung]
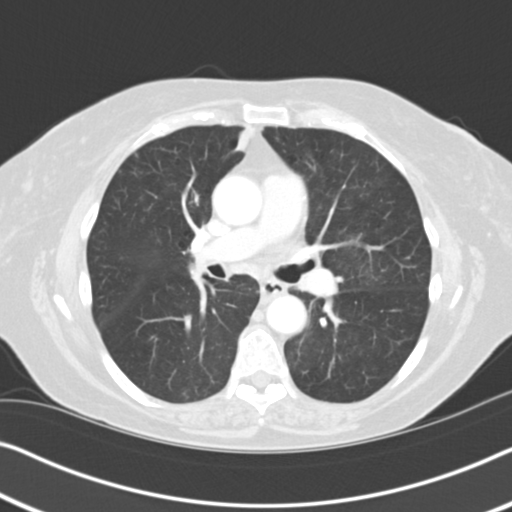
[im 70/116  mediastinal]
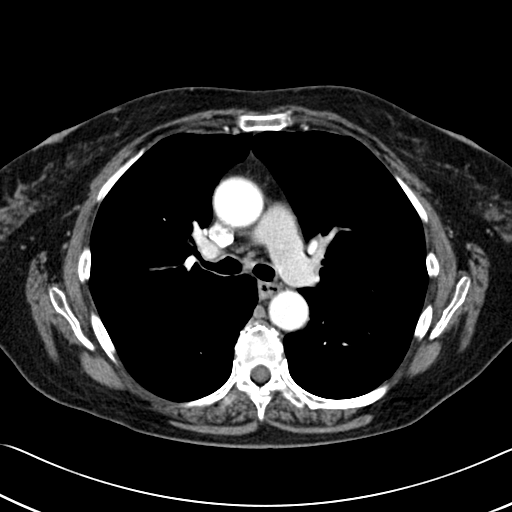
[im 70/116  lung]
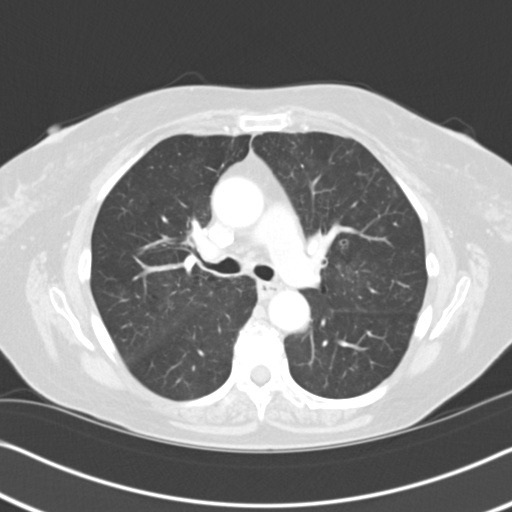
[im 77/116  lung]
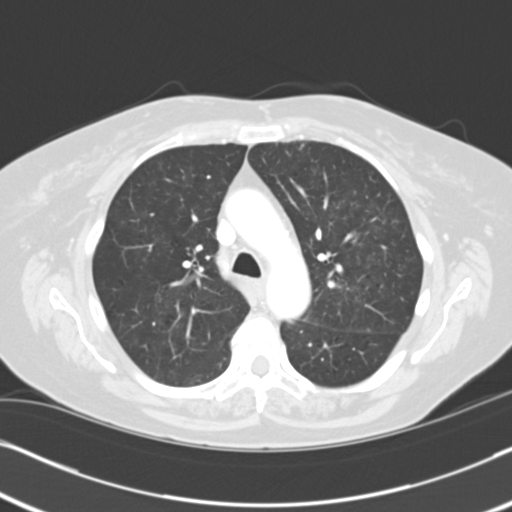
[im 86/116  lung]
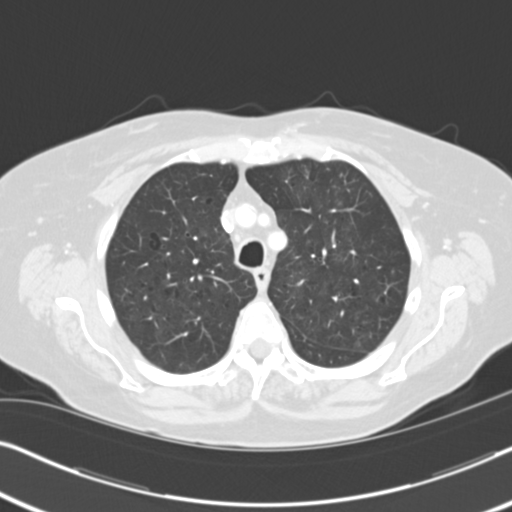
[im 93/116  lung]
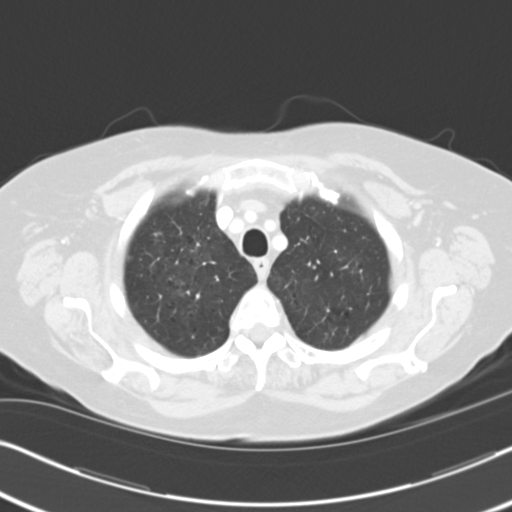
[im 98/116  mediastinal]
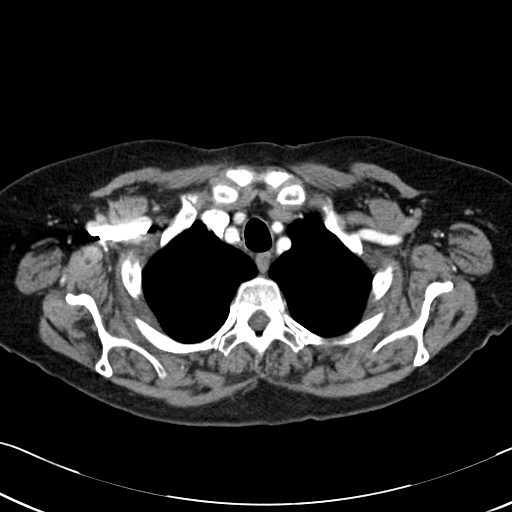
[im 98/116  lung]
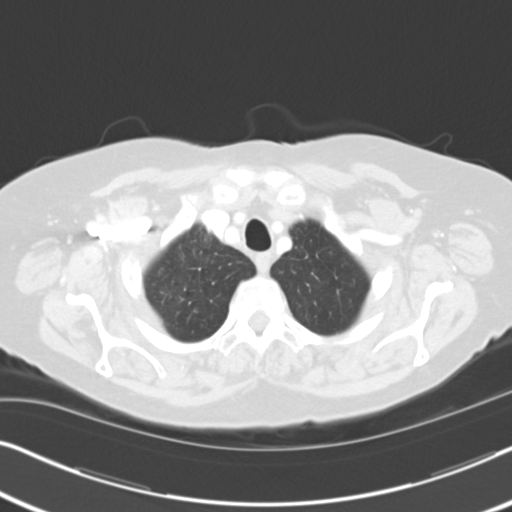
[im 107/116  lung]
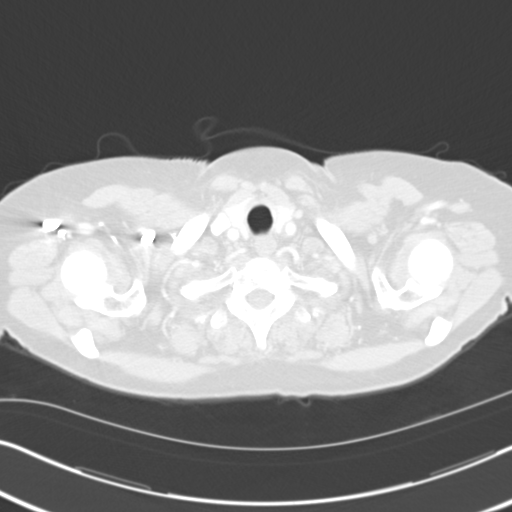

[14 of 32 positions shown; findings below may reference images not displayed]

PROCEDURE:     CT  - CT CHEST WITH CONTRAST  - December 16, 2011  [DATE]

RESULT:     Axial CT scanning was performed through the chest with
reconstructions at 3 mm intervals and slice thicknesses. Review of
multiplanar reconstructed images was performed separately on the VIA
monitor. The patient received 75 cc of Isovue-7HS. Comparison is made to the
study dated July 24, 2011.

At lung window settings there are emphysematous changes bilaterally. Small
bullous lesions are demonstrated. The area of patchy confluent density
previously demonstrated in the inferior aspect of the right upper lobe has
resolved. A nodule in the right lower lobe posteriorly has also resolved. On
the left associated with the lateral aspect of the major fissure on the
previous study an area of nodularity was demonstrated. This has resolved on
today's study. There are a few scattered 1 to 2 mm diameter subpleural
nodules in both lungs which allowing for differences in positioning appear
stable.

At mediastinal window settings the cardiac chambers are normal in size.
There is a small amount of pleural density in the lingula which is slightly
more conspicuous than in the past. The cardiac chambers are normal in size.
There are coronary artery calcifications present. The caliber of the
thoracic aorta is normal. There is a moderate-sized hiatal hernia-partially
intrathoracic stomach. No bulky mediastinal or hilar or axillary lymph nodes
are demonstrated. There is no pleural nor pericardial effusion.

Within the upper abdomen decreased density within the liver is consistent
with stable fatty infiltrative change. The spleen is not enlarged. There are
no adrenal masses. The thoracic vertebral bodies are preserved in height.
IMPRESSION: 1. There has been interval clearing of the patchy alveolar dash nodular
densities in the right lung and in the left upper lobe. A few scattered 1 to
2 mm diameter subpleural nodules remain.
2. There is no interstitial nor alveolar pneumonia. There are findings
consistent with COPD with numerous small bullous lesions.
3. There is no mediastinal nor hilar lymphadenopathy.
4. There is some consolidation in the lingula is likely reflects atelectasis.
5. There is a hiatal hernia-partially intrathoracic stomach. There is
decreased density throughout the liver consistent with fatty infiltration.

[REDACTED]

## 2013-09-21 IMAGING — US ABDOMEN ULTRASOUND
1 series · 14 of 25 positions shown · non-contrast
Comparison: none

REASON FOR EXAM: Epigastric abdominal pain
COMMENTS:

[Series 1: abdomen ultrasound · 0.31mm/px · 14 of 75 slices shown]
[im 1/75]
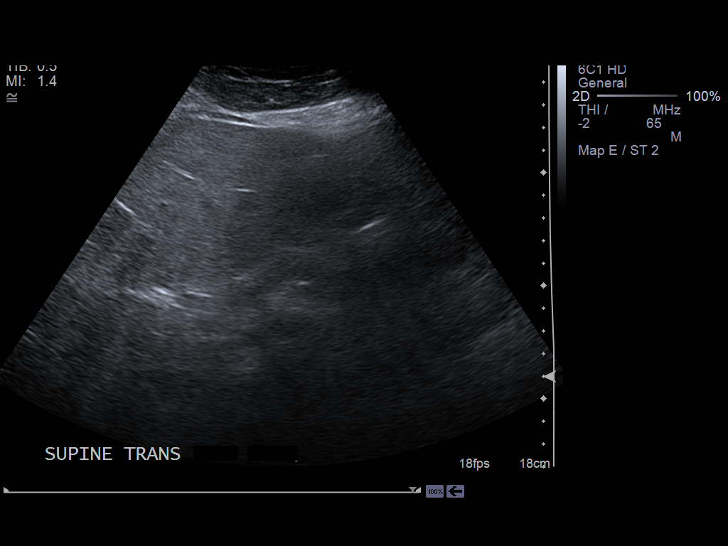
[im 7/75]
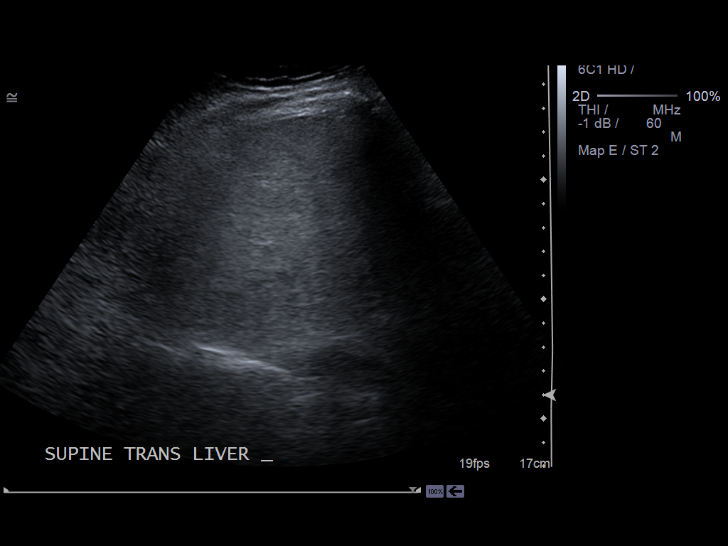
[im 13/75]
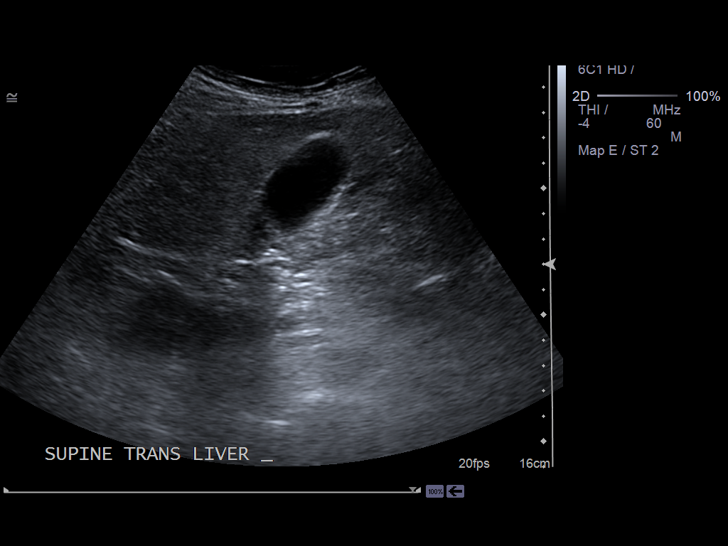
[im 19/75]
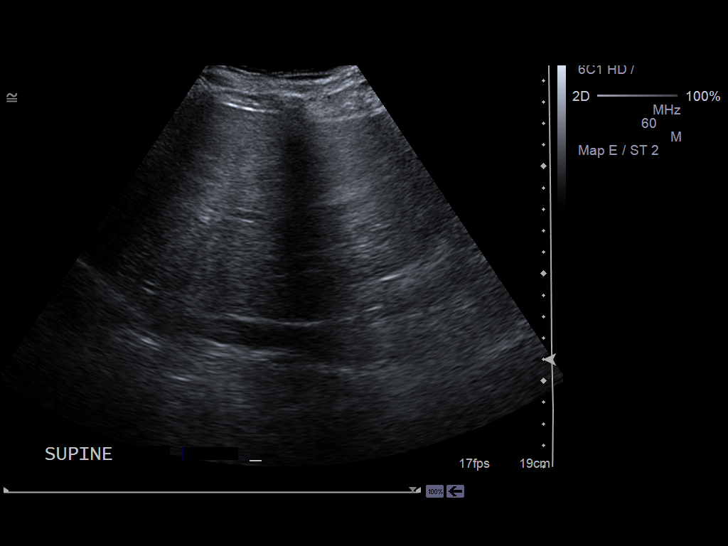
[im 25/75]
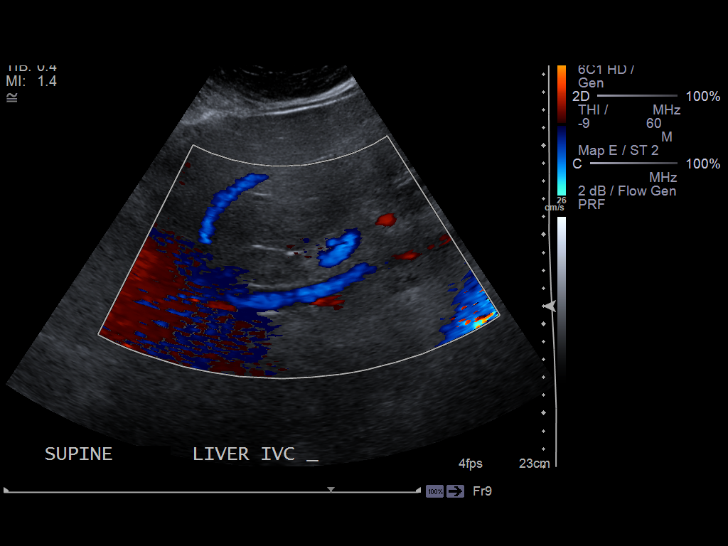
[im 28/75]
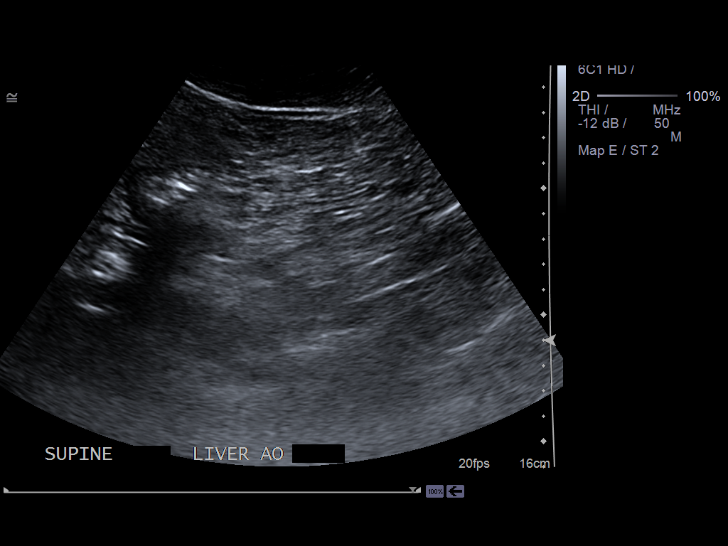
[im 34/75]
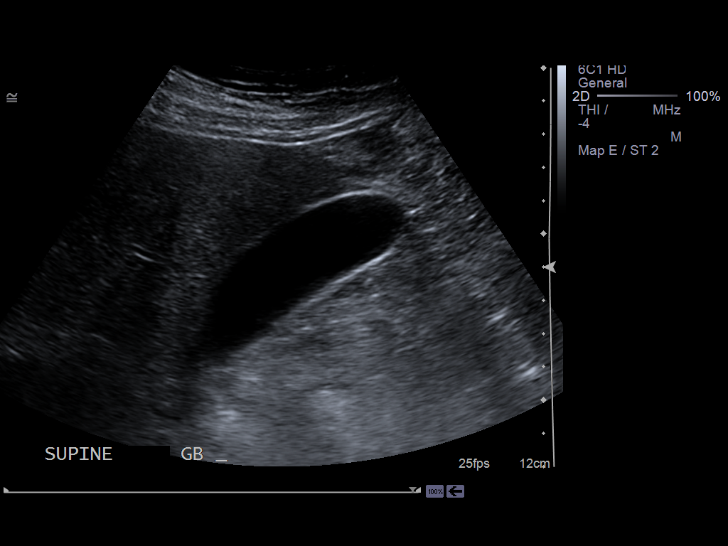
[im 41/75]
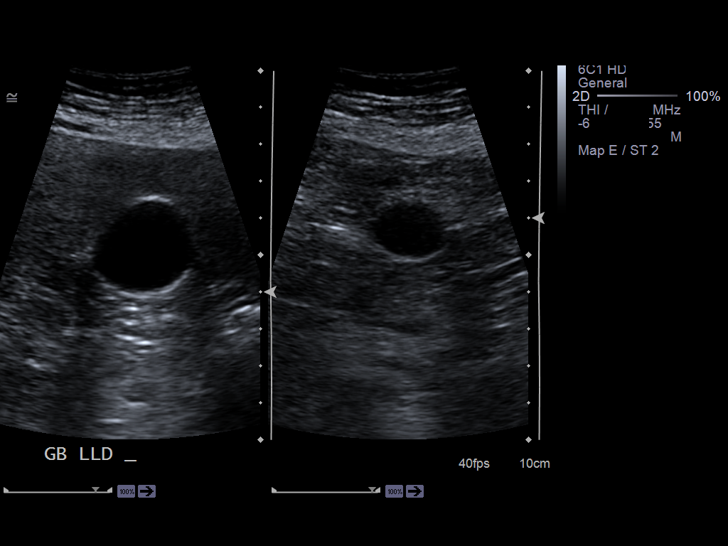
[im 47/75]
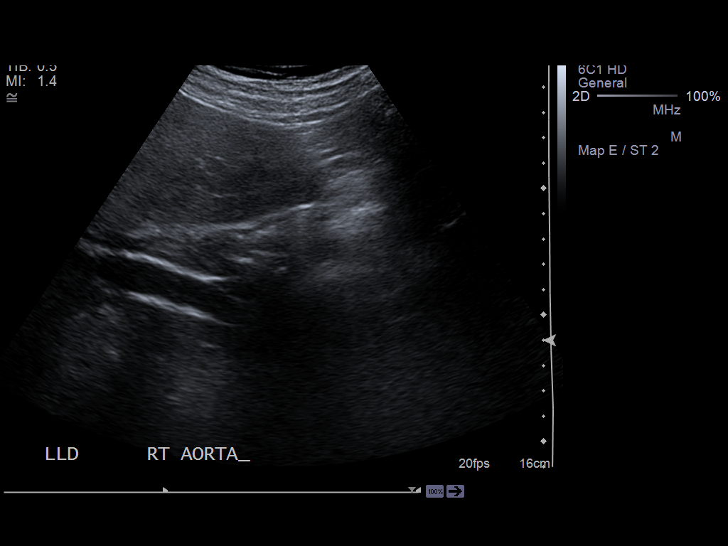
[im 50/75]
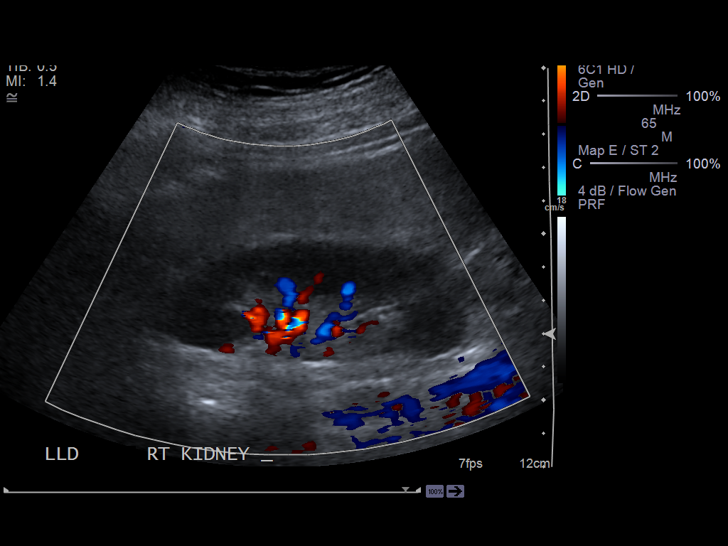
[im 56/75]
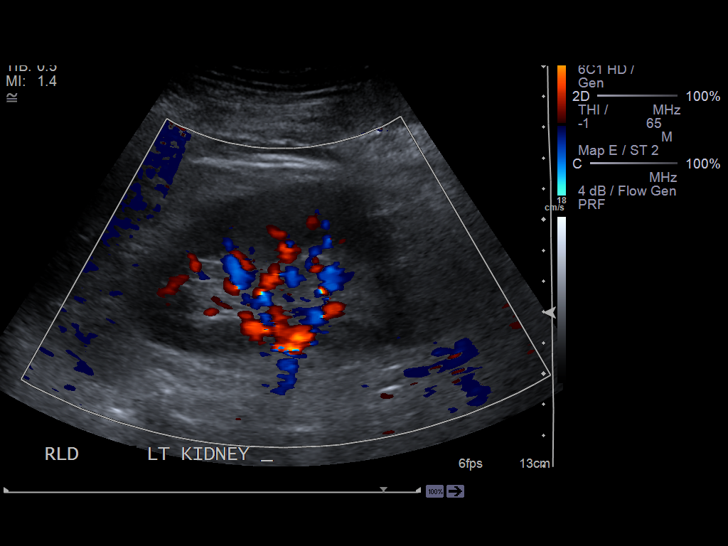
[im 62/75]
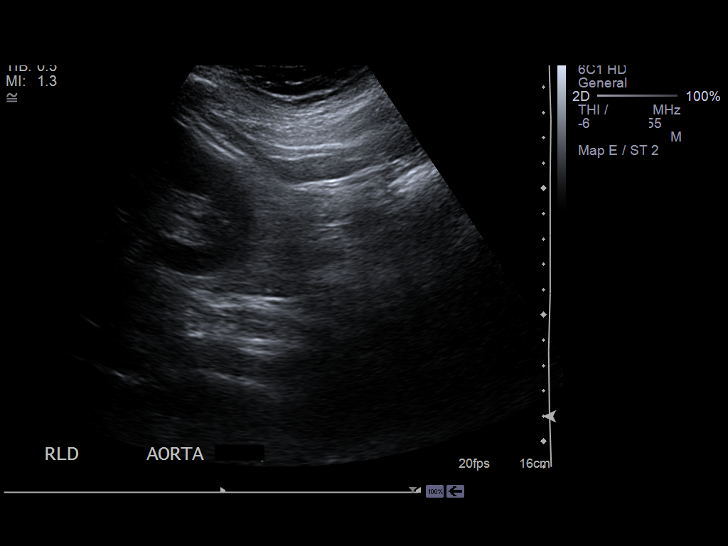
[im 68/75]
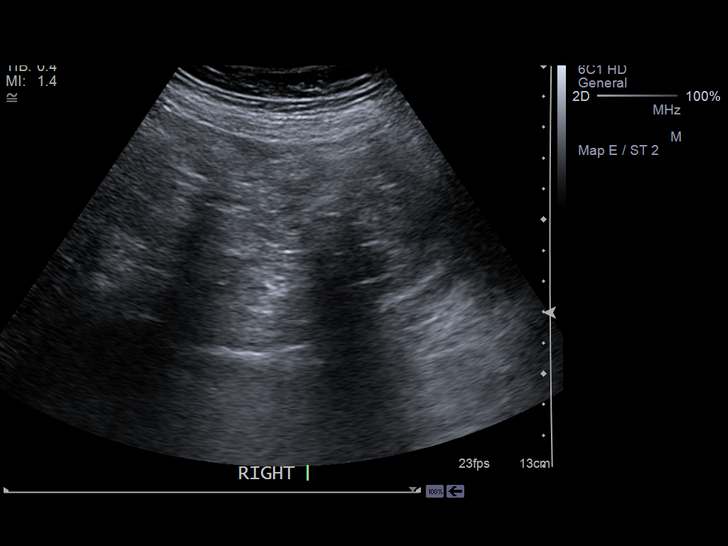
[im 75/75]
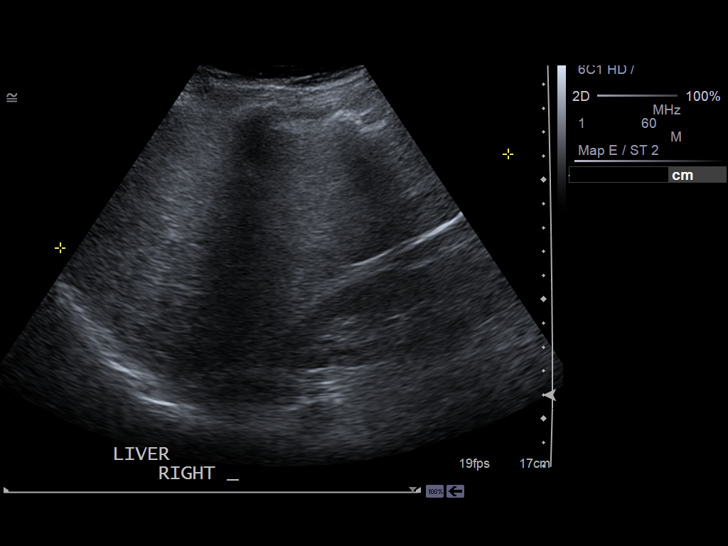

[14 of 25 positions shown; findings below may reference images not displayed]

PROCEDURE:     US  - US ABDOMEN GENERAL SURVEY  - April 24, 2012  [DATE]

RESULT:     The liver is mildly enlarged and exhibits somewhat increased
echotexture suggesting fatty infiltration. It is difficult to penetrate with
the ultrasound beam. Portal venous flow is normal in direction toward the
liver. The gallbladder is adequately distended with no evidence of stones,
wall thickening, or pericholecystic fluid. There is no positive sonographic
Murphy's sign. The common bile duct is normal at 3.9 mm in diameter.

Bowel gas obscures the pancreas. The spleen, abdominal aorta, and inferior
vena cava exhibit no acute abnormalities. The kidneys are normal in contour
and size and exhibit no evidence of obstruction.
IMPRESSION: 1. The gallbladder exhibits no evidence of stones or acute inflammation.
2. The liver is subjectively enlarged its echotexture is increased
suggesting fatty infiltration. No focal mass or ductal dilation is
demonstrated.
3. The common bile duct and kidneys exhibit no acute abnormality.

[REDACTED]

## 2014-07-12 NOTE — H&P (Signed)
PATIENT NAME:  Doris Blanchard, Doris Blanchard MR#:  [id number] DATE OF BIRTH:  12-27-1940  DATE OF ADMISSION:  12/24/2011  REFERRING PHYSICIAN: Daryel NovemberJonathan Williams, MD   ATTENDING PHYSICIAN: Ryane Canavan B. Jennet MaduroPucilowska, MD   IDENTIFYING DATA: Doris Blanchard is a 74 year old female with history of alcoholism.   CHIEF COMPLAINT: "I'm so tired".   HISTORY OF PRESENT ILLNESS: Doris Blanchard has been drinking heavily for at least 25 years. She has been consuming a fifth of hard liquor every day. She was unable to maintain almost any sobriety over this period of time. She was hospitalized at [facility] in the middle of August for alcohol detox. She was able to maintain sobriety for two weeks. She then relapsed and has been drinking steady. It is unclear why the patient came to the hospital today asking for alcohol detox and admission. She denies any symptoms of depression but does feel hopeless, helpless, and guilty. She believes that mood symptoms are related to pain and back surgery rather than to alcohol use. She explained that she has been drinking to deal with pain and disability. She denies psychotic symptoms. She denies other than alcohol drug abuse, although there are some concerns that the patient has been prescribed a stimulant and benzodiazepine by her primary provider   PAST PSYCHIATRIC HISTORY: She has been in alcohol detox and rehab several times including [facility] that she hates. She was briefly a patient of Dr. Mare FerrariLavine and made some appointments with Dr. Imogene Burnhen but never kept them. The patient reportedly has a history of stimulant dependence. She has been placed on Zoloft by Dr. Maryruth BunKapur during her consultation in August.   FAMILY PSYCHIATRIC HISTORY: Both parents with alcoholism. She also has a brother and a sister who are alcoholics.   PAST MEDICAL HISTORY:  1. Chronic obstructive pulmonary disease.  2. Hypertension. 3. Pulmonary nodules suspicious for metastatic  disease. 4. Gastroesophageal reflux disease. 5. Two back surgeries.   MEDICATIONS ON ADMISSION:  1. Spiriva 18 mg daily.  2. Zoloft 50 mg daily.  3. ProAir 2 puffs 4 times daily.  4. Omeprazole 20 mg twice daily.  5. Folic acid 400 mg daily.  6. Aspirin 81 mg daily.  7. Advair Diskus 250/50 mcg twice daily.   ALLERGIES: Codeine, Demerol, tape.   SOCIAL HISTORY: Her biological parents were alcoholics. They traveled a lot. She grew up in different states. Graduated from high school and completed two years of college. She stopped working in 1987. She has been married three times. She has two sons from her second marriage who are very interested in her care. Her current husband has been enabling drinking. He used to be an alcoholic himself but stopped 10 years ago. The patient has a history of DUI.   REVIEW OF SYSTEMS: CONSTITUTIONAL: Positive for 60? pound weight loss. EYES: No double or blurred vision. ENT: No hearing loss. RESPIRATORY: Positive for occasional shortness of breath. CARDIOVASCULAR: No chest pain or orthopnea. GASTROINTESTINAL: Positive for gastroesophageal reflux disease symptoms. GU: No incontinence or frequency. ENDOCRINE: No heat or cold intolerance. LYMPHATIC: No anemia or easy bruising. INTEGUMENTARY: No acne or rash. MUSCULOSKELETAL: No muscle or joint pain. NEUROLOGIC: No tingling or weakness. PSYCHIATRIC: See history of present illness for details.   PHYSICAL EXAMINATION:   VITAL SIGNS: Blood pressure 184/101, pulse 121, respirations 20, temperature 99.4.   GENERAL: This is a slender female in no acute distress.   HEENT: The pupils are equal, round, and reactive to light. Sclerae anicteric.  NECK: Supple. No thyromegaly.   LUNGS: Clear to auscultation. No dullness to percussion.   HEART: Regular rhythm and rate. No murmurs, rubs, or gallops.   ABDOMEN: Soft, nontender, nondistended. Positive bowel sounds.   MUSCULOSKELETAL: Normal muscle strength in all  extremities.   SKIN: No rashes or bruises.   LYMPHATIC: No cervical adenopathy.   NEUROLOGIC: Cranial nerves II through XII are intact.   LABORATORY DATA: Chemistries are within normal limits. Blood alcohol level on admission 0.189. LFTs within normal limits except for AST of 103. TSH 2.05. Urine tox screen negative for substances. CBC white blood count 4.3, hemoglobin 11.7, hematocrit 34.5, platelets 224. Urinalysis is not suggestive of urinary tract infection. Serum acetaminophen and salicylates are low.   MENTAL STATUS EXAMINATION ON ADMISSION: The patient is asleep and difficult to arouse. She is oriented to person, place, time, and situation. She is marginally cooperative. She is in bed wearing hospital scrubs. There is no eye contact. Her speech is soft. Mood is depressed with flat affect. Thought processing is slow. Thought content she denies suicidal or homicidal ideation. There are no delusions or paranoia. There are no auditory or visual hallucinations. Her cognition is difficult to assess. Her insight and judgment are poor.   SUICIDE RISK ASSESSMENT ON ADMISSION: This is a patient with a lifelong history of alcoholism who came to the hospital asking for alcohol detox.   ASSESSMENT:  AXIS I:  1. Alcohol dependence. 2. Major depressive disorder, recurrent, moderate. 3. History of benzodiazepine dependence.  4. History of amphetamine dependence.   AXIS II: Deferred.   AXIS III:  1. Chronic obstructive pulmonary disease.  2. Hypertension. 3. Chronic back pain.   AXIS IV: Mental illness, physical illness, substance abuse, family conflict.   AXIS V: GAF on admission 25.   PLAN: The patient was admitted to [facility] Medicine Unit for safety, stabilization, and medication management. She was initially placed on suicide precautions and was closely monitored for any unsafe behaviors. She underwent full psychiatric and risk assessment. She received  pharmacotherapy, individual and group psychotherapy, substance abuse counseling, and support from therapeutic milieu.  1. Alcohol detox. She is placed on a standard CIWA protocol. Will monitor for symptoms of alcohol withdrawal.  2. Mood. Will wait for the patient. She has not been compliant with Zoloft at home. Will continue monitoring symptoms of depression.  3. Substance abuse treatment to be established. The patient hates [facility]. She may opt for another program.  4. Medical. Will continue on medications as prescribed by her primary provider.  5. She will be discharged to home with her husband.   ____________________________ Ellin Goodie. Jennet Maduro, MD jbp:drc D: 12/25/2011 18:12:45 ET T: 12/26/2011 07:12:45 ET JOB#: [id number]  cc: Hesston Hitchens B. Jennet Maduro, MD, <Dictator> Shari Prows MD ELECTRONICALLY SIGNED 12/27/2011 8:07

## 2014-07-15 NOTE — Consult Note (Signed)
CC: alcohol abuse with hepatitis.  She is getting a breathing treatment and is improved,  has exp wheeze some still but less.  Got haldol last nite and rested better.  Family worried about withdrawal seizures if doesn't get enough Ativan.  Abd not tender to my exam.  chest with slight exp wheeze, better air flow.Na up to 129, K 3.3 TB 1.3, sgot 170 sgpt 126,  GI will not see over weekend unless you call about a problem.  Dr. Niel HummerIftikhar is on call.  Electronic Signatures: Scot JunElliott, Verdell Dykman T (MD)  (Signed on 14-Feb-14 16:42)  Authored  Last Updated: 14-Feb-14 16:42 by Scot JunElliott, Haunani Dickard T (MD)

## 2014-07-15 NOTE — Consult Note (Signed)
Brief Consult Note: Diagnosis: Alcohol dependence.   Patient was seen by consultant.   Consult note dictated.   Recommend further assessment or treatment.   Discussed with Attending MD.   Comments: [name] has a long h/o alcoholism. She was admitted to medical floor for pancreatitis. She is on CIWA. She is somewhat interested in treatment of alcoholism beyond detox.   She was provided with information on [place].   PLAN: 1. Please continue CIWA.  2. The patient is wellcome to psychiatry to complete CIWA if necessary.  3. I will follow up.  Electronic Signatures: Doris Blanchard, Doris Blanchard (MD)  (Signed 11-Feb-14 23:44)  Authored: Brief Consult Note   Last Updated: 11-Feb-14 23:44 by Doris Blanchard, Doris Blanchard (MD)

## 2014-07-15 NOTE — Consult Note (Signed)
Brief Consult Note: Diagnosis: Alcoholic pancreatitis/hepatitis.   Patient was seen by consultant.   Consult note dictated.   Comments: This pt is well known to Dr. Mechele CollinElliott. IV fluids, labs, pain medication, antiemetics. Supportive care. Daily labs. This case d/w Dr.Elliott in collaboration of care.  Electronic Signatures: Rowan BlaseMills, Mixtli Reno Ann (NP)  (Signed 10-Feb-14 15:10)  Authored: Brief Consult Note   Last Updated: 10-Feb-14 15:10 by Rowan BlaseMills, Raileigh Sabater Ann (NP)

## 2014-07-15 NOTE — Consult Note (Signed)
PATIENT NAME:  Doris Blanchard, Rosita H MR#:  [id number] DATE OF BIRTH:  02-21-1941  DATE OF CONSULTATION:  05/04/2012  REFERRING PHYSICIAN:  Marlaine HindAmir Darwish, MD  CONSULTING PHYSICIAN: Manfred Shirtsobert Elliot, MD/Delancey Moraes A. Arvilla MarketMills, ANP  REASON FOR CONSULTATION: Pancreatitis with vomiting.   HISTORY OF PRESENT ILLNESS: The patient has a history of chronic alcoholism and reported a 2-week history of epigastric discomfort and increased reflux. She has been taking Prilosec and Tums without resolution. The patient developed repetitive vomiting after drinking a fifth of bourbon. The patient has had history of chronic daily bourbon use. She denies any hematemesis or coffee-ground emesis. She is still experiencing some stomach discomfort. She is on CIWA precautions at this time. Admission lipase was 680 and she has elevated liver enzymes consistent with alcoholic pancreatitis and hepatitis. GI is asked to see the patient regarding further evaluation and management.   This patient is well known to Dr. Mechele CollinElliott, as he had just seen her for history of nausea, abdominal distention, and elevated liver enzymes on 04/21/2012 and 04/28/2012. The patient had reported she was drinking every day for the last 35 years and normally drinks bourbon a fifth a day or half a gallon over three days. Previous EGD was in 2000. Previous EGD in 1998 showed a severe erosive esophagitis, Mallory-Weiss tear. She was noted to have alcoholism at that time. Hemoglobin was 10.3, platelet count 185,000, WBC 2.8, Pro-time 10.9 with AST 183, ALT 105, creatinine 0.6, albumin 4.6, B12 greater than 1,500, folic acid 22, CRP 1.4. There are plans to do stool cards, abdominal ultrasound showed no evidence of gallbladder problems. The liver subjectively enlarged an echotexture suggesting fatty infiltration and the common bile duct and kidneys showed no abnormalities. The low white blood cell count was thought to be secondary to bone marrow suppression because of alcoholism  and her platelet count and then Pro-time suggested synthetic function of the liver is still good. There is an appointment and plan for Dr. Sherrlyn HockPandit to see her in hematology to make sure she does not have any other problems for the leukopenia and anemia. The patient has voiced interest in detoxification and is going to pursue this during this admission.   PAST MEDICAL HISTORY: 1.  GERD.   2.  Alcohol abuse.  3.  Tobacco abuse.  4.  Asthma.  5.  Anxiety.  6.  Depression.  7.  Osteoarthritis.  8.  Inflammatory arthritis.  9.  COPD.  PAST SURGICAL HISTORY: 1.  Excision of fibroadenoma lateral aspect left breast, young adult, back surgery 2012.  2.  Back surgery x2.  3.  Pulmonary nodules.   MEDICATIONS ON ARRIVAL:  1.  Advair Diskus 250/50 mcg twice daily.  2.  ProAir HFA inhaler two puffs every four hours p.r.n. 3.  Spiriva one inhalation daily.  4.  Omeprazole 40 mg twice daily. 5.  Multivitamin once daily. 6.  Folic acid 0.4 mg once daily.  7.  Oxygen therapy at bedtime.   ALLERGIES: CODEINE CAUSES GI DISTRESS, DEMEROL CAUSES GASTROINTESTINAL SIDE EFFECTS, TAPE CAUSES SKIN RASH.   HABITS: Ex-chronic smoker two packs a day since age 74. She quit one year ago. Positive bourbon, one fifth daily for many years. The patient states she started drinking in college, 35 years.   REVIEW OF SYSTEMS: She has had nausea with poor appetite. No fevers or chills. No history of hepatitis other than with alcohol. The patient had seen bright red blood per rectum, stool softeners to move her bowels. She denies any  current lower GI complaints. Sometimes she has alternating constipation and diarrhea. Last colonoscopy was in 2007 performed by Dr. Daleen Squibb,  performed for screening purposes, notable for multiple small-mouthed diverticula in the sigmoid colon, internal nonbleeding moderate hemorrhoids. Remaining 10 systems negative.   PHYSICAL EXAMINATION: VITAL SIGNS: Temperature 98.7, pulse 109, respirations 18,  blood pressure 164/91, pulse oxygen on 2 liters 96%.  GENERAL: Elderly Caucasian female resting. She is somewhat drowsy with Ativan. The patient arouses, answers questions.  HEENT: Head is normocephalic. Conjunctivae pink.  NECK: Supple. Trachea midline.  HEART: Heart tones S1, S2 without murmur or gallop.  LUNGS: Have scattered wheezing bilateral anterior and posterior. Respirations are nonlabored. She is utilizing oxygen.  ABDOMEN: Soft. Positive moderate tenderness mid generalized abdomen,  no rigidity, rebound or guarding. This is moderate tenderness. Bowel sounds are present. Abdomen is nondistended.  RECTAL: Deferred.  SKIN: Warm and dry without rash or edema. No joint swelling or clubbing noted.  PSYCHIATRIC: The patient is awake, speech is clear, and says she feels drowsy.  LABORATORY, DIAGNOSTIC, AND RADIOLOGICAL DATA: Admission labs with glucose 73, BUN 6, creatinine 0.59, sodium 134, potassium 3.7, chloride 94, lipase 680, albumin 3.7, total bilirubin 1, alkaline phosphatase 155, AST 459, ALT 213. CPK, MB, troponin negative. WBC 3.7, hemoglobin 10.3, platelet count 181,000, MCV 103, MCH 34.2. Amber-colored urine, 1+ esterase, mucus present, 19 WBCs per high-power field.   Repeat lipase today 680.   IMPRESSION:  1.  Alcoholic pancreatitis and hepatitis.  2.  Alcohol abuse.  3.  Gastroesophageal reflux.   PLAN:  Supportive care with IV fluids, pain management, anti-emetics, serial laboratory studies. The patient has been seen by behavioral medicine for alcohol detox wishes. She was encouraged to proceed. She does have been an acute on chronic transaminitis, will need to be followed. This case was discussed with Dr. Mechele Collin in collaboration of care. GI will continue to monitor patient's progress.   Thank you for the consultation.   These services provided by Cala Bradford A. Arvilla Market, ANP under the collaborative agreement with Manfred Shirts, MD.       ____________________________ Ranae Plumber. Arvilla Market, ANP kam:cc D: 05/04/2012 16:17:30 ET T: 05/04/2012 19:17:18 ET JOB#: [id number]  cc: Cala Bradford A. Arvilla Market, ANP, <Dictator> Ranae Plumber. Suzette Battiest, MSN, ANP-BC Adult Nurse Practitioner ELECTRONICALLY SIGNED 05/05/2012 17:20

## 2014-07-15 NOTE — Consult Note (Signed)
Brief Consult Note: Diagnosis: Alcohol dependence.   Patient was seen by consultant.   Consult note dictated.   Recommend further assessment or treatment.   Discussed with Attending MD.   Comments: [name] has a long h/o alcoholism. She was admitted to medical floor for pancreatitis. She is on CIWA. She is somewhat interested in treatment of alcoholism beyond detox. We discussed different options. [facility]. There are financial resources to pay for a private hospital. We will look into different options.  PLAN: 1. Please continue CIWA.  2. The patient is wellcome to psychiatry to complete CIWA if necessary.  3. We will try to identify a program that would fit this patient's needs.  4. I will follow up.  Electronic Signatures: Doris Blanchard, Doris Blanchard (MD)  (Signed 10-Feb-14 15:17)  Authored: Brief Consult Note   Last Updated: 10-Feb-14 15:17 by Doris Blanchard, Doris Blanchard (MD)

## 2014-07-15 NOTE — Consult Note (Signed)
Brief Consult Note: Diagnosis: Alcohol dependence.   Patient was seen by consultant.   Consult note dictated.   Recommend further assessment or treatment.   Orders entered.   Discussed with Attending MD.   Comments: [name] has a long h/o alcoholism. She was admitted to medical floor for alcoholic pancreatitis/hepatitis. She is on CIWA. She is somewhat interested in treatment of alcoholism beyond detox. She was provided with information on the PAVILION program in [place] area but did not have a chance to discuss it with her husband. She would prefer to do it herself. There is substantial cost involved.  Per Dr. Earnest Blanchard's note, the family worries about withdrawal seizures. BAL was not done on admission. It is not clear when her last drink was. She received 12 mg of ativan on 2/11, 7 mg on 2/12 and 6 mg on 2/13. No Ativan was given today per CIWA.   MSE: Alert, oriented, pleasant, dishaveled, no safety issues, no psychotic symptoms, positive for fatigue and mild tremors.   PLAN: 1. I will start brief Ativan taper over the weekend.   2. The patient is wellcome to psychiatry to complete CIWA if necessary.  3. She will decide on PAVILION program participation. There are less expensive alternatives including fre of charge [facility] program.   4. I will not be in the hospital this weekend. Please page me if problems.  Electronic Signatures: Doris Blanchard, Doris Blanchard (MD)  (Signed 14-Feb-14 19:38)  Authored: Brief Consult Note   Last Updated: 14-Feb-14 19:38 by Doris Blanchard, Doris Blanchard (MD)

## 2014-07-15 NOTE — Consult Note (Signed)
Pt alcoholic and has resp problems with signif exp wheeze despite SVN and prednisone.  Former smoker with COPD. No insp rales heard.  Her BP is up so have room for dose of diuretic and will give lasiz 20mg  now since renal funct good and neck veins are noted.  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  If it is above 125 would not treat it with saline as she will third space it. [place] from [place] in room  Electronic Signatures: Scot Junlliott, Makaylee Spielberg T (MD)  (Signed on 12-Feb-14 10:34)  Authored  Last Updated: 12-Feb-14 10:34 by Scot JunElliott, Syvanna Ciolino T (MD)

## 2014-07-15 NOTE — Consult Note (Signed)
PATIENT NAME:  Doris Blanchard, Doris Blanchard MR#:  [id number] DATE OF BIRTH:  [birth year]  DATE OF CONSULTATION:  05/14/2012  REFERRING PHYSICIAN:  Marya Amsler. Dareen Piano, MD CONSULTING PHYSICIAN:  Lennox Pippins, MD  CHIEF COMPLAINT: Hyponatremia.   HISTORY OF PRESENT ILLNESS: The patient is a pleasant 74 year old Caucasian female with past medical history of chronic alcoholism, COPD, hypertension, GERD, history of pulmonary nodules, history of back surgery x 2, who presented originally to [facility] on 05/03/2012 with abdominal discomfort and pain as well as vomiting. As above, she has history of extensive alcohol abuse and was drinking approximately a quart of bourbon a day for quite some time. Initially she was found to have alcoholic hepatitis along with pancreatitis. She also developed alcohol withdrawal during the course of this admission. We are now consulted for the evaluation and management of hyponatremia. Upon initial presentation, the patient's serum sodium was found to be 134. Gradually over the course of the hospitalization, this has declined. Serum sodium at present is 126. The patient reports to me that given her alcoholism, her p.o. intake was quite poor. She describes a tea and toast type of diet while at home. This suggests that she had very poor solute intake. She had some initial workup of the hyponatremia and had a urine sodium of 45. Urine osmolality appears to be quite low at 220. In addition to this, she has had COPD exacerbation during the course of this hospitalization. SIADH at times is also associated with COPD.   PAST MEDICAL HISTORY:  1.  Chronic alcoholism.  2.  COPD.  3.  Hypertension.  4.  GERD.   PAST SURGICAL HISTORY: Back surgery x2.   ALLERGIES: CODEINE, DEMEROL AND TAPE.  CURRENT INPATIENT MEDICATIONS: Include 0.9 normal saline with 20 mEq of potassium chloride per liter running at 75 mL/h, Advair 250/50 one puff inhaled b.i.d., Haldol 2 mg IV q.4  hours p.r.n., heparin 5000 units subcutaneous q.12 hours, Ativan 2 mg IV q.1 hour p.r.n., multivitamin 1 tablet p.o. daily, nitroglycerin 1 inch topical q.6 p.r.n. angina, Phenergan 6.25 to 12.5 mg IM q.4 hours p.r.n., tiotropium 1 capsule inhaled daily, albuterol 2 puffs inhaled q.4 hours, magnesium hydroxide 30 mL p.o. q.12 hours p.r.n. constipation, Zoloft 50 mg p.o. daily, Protonix 40 mg p.o. b.i.d., magnesium oxide 400 mg p.o. b.i.d., prednisone 60 mg p.o. daily, levofloxacin 250 mg p.o. daily.   SOCIAL HISTORY: The patient lives in [place]. She is married. She used to work in [employer]. She has a history of tobacco and alcohol abuse. No reported illicit drug use.   FAMILY HISTORY: The patient's mother died of pneumonia. The patient's father died from esophageal bleeding. He also had a history of alcoholism.   REVIEW OF SYSTEMS:    CONSTITUTIONAL: The patient denies fevers, chills. She is unclear as to whether she has had weight loss.  EYES: Denies diplopia, blurry vision.  HEENT: Denies headaches, hearing loss. Denies epistaxis.  CARDIOVASCULAR: Denies chest pain, palpitations, PND, orthopnea.  RESPIRATORY: Reports a cough and shortness of breath.  GASTROINTESTINAL: Denies vomiting or bloody stools. Has had some periodic nausea.  GENITOURINARY: Denies frequency, urgency or dysuria.  MUSCULOSKELETAL: Denies joint pain, swelling or redness.  INTEGUMENTARY: Denies skin rashes or lesions.  NEUROLOGIC: Denies focal weakness or numbness but does have generalized weakness.  PSYCHIATRIC: Has history of alcoholism.  ENDOCRINE: Denies polyuria, polydipsia, polyphagia.  HEMATOLOGIC AND LYMPHATIC: Denies easy bruisability, bleeding or swollen lymph nodes.  ALLERGY AND IMMUNOLOGIC: Denies seasonal allergies or  history of immunodeficiency.   PHYSICAL EXAMINATION:  VITAL SIGNS: Temperature 98.4, pulse 83, respirations 20, blood pressure 149/84, pulse oximetry 94% on room air.  GENERAL: Disheveled  appearing female, currently in no acute distress.  HEENT: Normocephalic, atraumatic. Extraocular movements are intact. Pupils equal, round and reactive to light. No scleral icterus. Conjunctivae are pink. No epistaxis noted. Gross hearing intact. Oral mucosae are moist.  NECK: Supple without JVD or lymphadenopathy.  LUNGS: Bilateral wheezing with normal respiratory effort.  HEART: S1, S2 regular rate and rhythm. No murmurs, rubs or gallops appreciated.  ABDOMEN: Soft, nontender, nondistended with bowel sounds present. No rebound, no guarding. Liver edge was palpated below the costal margin.  EXTREMITIES: No clubbing, cyanosis or edema.  NEUROLOGIC: The patient is alert and oriented to time, person and place. Strength is 5/5 in both upper and lower extremities.  SKIN: Warm and dry. No rashes noted.  MUSCULOSKELETAL: No joint redness, swelling or tenderness appreciated.  GENITOURINARY: No suprapubic tenderness noted.  PSYCHIATRIC: The patient is with appropriate affect and appears to have insight into her current illness.   LABORATORY, DIAGNOSTIC AND RADIOLOGICAL DATA: Urine osmolality 220. Urine chloride 36, urine sodium 45. CBC shows WBC 6.1, hemoglobin 9.5, hematocrit 28, platelets 369, MCV 100. BMP shows sodium 126, potassium 3.8, chloride 91, CO2 of 23, BUN 13, creatinine 0.78, glucose 83. Chest x-ray shows hyperinflation with mild cardiomegaly. CT scan of the chest with contrast was negative for acute pulmonary embolism. There was no evidence of pneumonia or atelectasis. Findings were consistent with COPD. There was a moderate-sized hiatal hernia noted.   IMPRESSION: This is a 74 year old Caucasian female with past medical history of chronic alcohol abuse, chronic obstructive pulmonary disease, hypertension, gastroesophageal reflux disease, history of back surgery x 2, who presented to [facility] with abdominal pain and found to have alcoholic hepatitis, pancreatitis. She has  also developed worsening hyponatremia over the course of the hospitalization as well as a chronic obstructive pulmonary disease exacerbation.   PROBLEM LIST:  1.  Hyponatremia.  2.  Chronic alcohol abuse with history of a tea and toast diet at home  3.  Chronic malnutrition with a low serum albumin of 3.2.  4.  Macrocytic anemia, improving.   PLAN: The patient has developed worsening hyponatremia over the course of this hospitalization. At home, she describes to me a tea and toast diet in addition to the alcohol abuse. She may have a very low solute diet. She states that the diet provided in the hospital is not very palatable. Therefore, she has not been eating as much as we would have hoped. Her serum sodium today was found to be 126. In addition, the patient could have an element of SIADH from her underlying COPD as well. For now, we would recommend continued hydration for at least another day. Hopefully this will cause serum sodium to rise satisfactorily. If this is unsuccessful, we may need to give consideration to SIADH as a cause of her hyponatremia. We could then potentially consider conivaptan as therapy. We will obtain further testing including serum osmolality, TSH, cortisol level and serum uric acid. In regards to treatment of underlying alcohol abuse, I defer this to Dr. Dareen PianoAnderson. She has been receiving multivitamin and previously was also receiving thiamine supplementation. She has also received medications to prevent delirium tremens including lorazepam. She has also received treatment for underlying COPD exacerbation with the prednisone and levofloxacin. We will re-evaluate the patient's case in the a.m.   I would  like to thank Dr. Dareen Piano for this kind referral. Further plan as the patient progresses.  ____________________________ Lennox Pippins, MD [email or web address] D: 05/14/2012 18:30:56 ET T: 05/14/2012 20:26:46 ET JOB#: [id number]  cc: Lennox Pippins, MD, <Dictator> Lennox Pippins  MD ELECTRONICALLY SIGNED 06/26/2012 11:30

## 2014-07-15 NOTE — H&P (Signed)
PATIENT NAME:  Doris Blanchard, Doris Blanchard MR#:  [id number] DATE OF BIRTH:  29-Mar-1940  DATE OF ADMISSION:  05/03/2012  PRIMARY CARE PHYSICIAN:  Einar CrowMarshall Anderson, MD  REFERRING PHYSICIAN:  Sheryl L. Mindi JunkerGottlieb, MD  CHIEF COMPLAINT:  Abdominal discomfort and pain x 2 weeks associated with repetitive vomiting.   HISTORY OF PRESENT ILLNESS:  The patient is a 74 year old pleasant Caucasian female with history of chronic alcoholism. She drinks a fifth of bourbon liquor daily. For the last 2 weeks, she is having epigastric discomfort and increased acid reflux. She is taking TUMS and Prilosec which helps partially. Along with that, she has repetitive vomiting. Finally, she recently states that she is unable to keep up with food and drink and she is unable to hydrate herself. Evaluation of the Emergency Department reveals evidence of acute pancreatitis and hepatitis which appears to be alcoholic in origin as well. The patient at this time is showing a desire that she wants to detox from alcohol, although she is somewhat hesitant.   REVIEW OF SYSTEMS:  CONSTITUTIONAL: Denies any fever. No chills. She has mild fatigue.  EYES: No blurring of vision. No double vision.  ENT: No hearing impairment. No sore throat. No dysphagia.  CARDIOVASCULAR: No chest pain. No shortness of breath. No syncope. No palpitations.  RESPIRATORY: No shortness of breath. No cough. She has occasional wheezing for which she is taking inhaler. No hemoptysis.  GASTROINTESTINAL: She has epigastric pain described as a burning-like fire. It is intermittent. The severity is much less after she takes [place] and Prilosec. It exacerbates if she does not take it. Denies any hematemesis. No melena or hematochezia, but she reports postprandial vomiting.  GENITOURINARY: No dysuria. No frequency of urination.  MUSCULOSKELETAL: No joint pain or swelling. No muscular pain or swelling.  INTEGUMENTARY: No skin rash. No ulcers.  NEUROLOGY: No focal weakness. No  seizure activity. No headache.  PSYCHIATRY: No anxiety or depression.  ENDOCRINE: No polyuria or polydipsia. No heat or cold intolerance.  HEMATOLOGY: No easy bruisability. No lymph node enlargement.   PAST MEDICAL HISTORY:  Chronic alcoholism, chronic obstructive pulmonary disease, systemic hypertension, gastroesophageal reflux disease, pulmonary nodules.   PAST SURGICAL HISTORY:  Back surgery x 2.   FAMILY HISTORY:  Both parents were alcoholics. Her brother had lung cancer. She had an aunt who suffers from liver cirrhosis.   SOCIAL HISTORY:  She is married living with her husband. She has been married 3 times. She finished high school and completed 2 years of college.   SOCIAL HABITS:  Ex-chronic smoker 2 packs a day since age of 74. She quit a year ago. She drinks bourbon, one-fifth daily for many years.   ADMISSION MEDICATIONS:  Advair Diskus 250/50 mcg twice a day, ProAir HFA inhaler 2 puffs q. 4 hours p.r.n., Spiriva 1 inhalation once a day, omeprazole 40 mg 1 capsule twice a day, multivitamin once a day, folic acid 0.4 mg once a day.   ALLERGIES:  CODEINE CAUSING GI DISTRESS, DEMEROL ALSO GI SIDE EFFECTS, TAPE CAUSING SKIN RASH.   PHYSICAL EXAMINATION: VITAL SIGNS: Blood pressure 138/69, respiratory rate was 24 to 25, pulse 106, temperature 97.8 and oxygen saturation 94%.  GENERAL APPEARANCE: Elderly female, lying in bed, was sleeping when I entered her room. She is awake now and oriented. In no acute distress.  HEAD AND NECK: No pallor. No icterus. No cyanosis. Ear examination revealed normal hearing, no discharge, no lesions. Nasal mucosa was normal. No discharge. No ulcers. Oropharyngeal area was normal  without any ulcers. No oral thrush. Eye examination reveals normal eyelids and conjunctivae. Pupils were irregular especially the left one and is slightly larger than the right pupil. Shows previous cataract surgery and lens implant. The pupil is about 4 to 5 mm, sluggishly reactive to  light.  NECK: Supple. Trachea at midline. No thyromegaly. No cervical lymphadenopathy. No masses.  HEART: Normal S1, S2. Distant heart sounds. No murmur was appreciated. No gallop. No carotid bruits.  RESPIRATORY: Normal breathing pattern without use of accessory muscles. No rales. A few scattered wheezing and occasional rhonchi.  ABDOMEN: Slightly distended. She has mild to moderate tenderness at the mid abdomen without rigidity. No rebound. No hepatosplenomegaly. No masses. No hernias.  SKIN: No ulcers. No subcutaneous nodules.  MUSCULOSKELETAL: No joint swelling. No clubbing.  NEUROLOGIC: Cranial nerves II through XII are intact. No focal motor deficit.  PSYCHIATRIC: The patient is alert and oriented x 3. Mood and affect are normal.   LABORATORY FINDINGS AND RADIOLOGIC DATA:  She had an ultrasound of the abdomen which was negative for the gallbladder. There is evidence of hepatomegaly with mild fatty liver infiltration. Incomplete visualization of the pancreas due to overlying bowel gas. EKG showed normal sinus rhythm with a rate of 88 per minute. Serum glucose was 73, followup was 63 and then 84, BUN 6, creatinine 0.5, sodium 134, potassium 3.7, calcium 8.6. Albumin is 3.7, lipase was 680, alkaline phosphatase 155, AST 459, ALT 213. CPK is 51. Troponin is 0.02. CBC showed white count of 3700, hemoglobin 10.3, hematocrit 31 (of note, she had hemoglobin of the same at 10.3 with hematocrit of 31 done at the [facility] on April 21, 2012), platelet count is 181, MCV 103, MCH 34, MCHC 33. Of note, the patient had a recent B12 that was more than 1500 and folate was more than 22 (this was done in January last month). Urinalysis showed clear urine, 19 white blood cells and +1 leukocyte esterase.   ASSESSMENT:   1.  Acute alcoholic pancreatitis.  2.  Alcoholic hepatitis.  3.  Alcohol dependence.  4.  Anemia. This appears to be anemia of chronic disease.  5.  Gastroesophageal reflux disease.  6.   Chronic obstructive pulmonary disease.  7.  Systemic hypertension.  8.  Pulmonary nodules.   PLAN:  We will admit to the medical floor with IV fluids, thiamine, multivitamins and Phenergan intravenously p.r.n. to control vomiting and nausea. The patient's gastroenterologist is Dr. Mechele Collin and he just saw her recently. I will consult him for followup. I will repeat her lipase tomorrow. We will place her on CIWA protocol. The patient and the husband are now willing to consider an alcohol detox program. The patient accepted, although after hesitation. She just wanted to be treated on the medical floor rather than transferring her to the psychiatric unit. We will intensify treatment for acid reflux with Protonix 40 mg intravenously twice a day. Continue home medications as listed above. The patient does have a living will and her CODE STATUS IS FULL CODE.   TIME SPENT IN EVALUATING THIS PATIENT AND REVIEWING MEDICAL RECORDS:  Took more than 55 minutes.    ____________________________ Carney Corners. Rudene Re, MD amd:si D: 05/03/2012 23:26:11 ET T: 05/03/2012 23:42:15 ET JOB#: [id number]  cc: Carney Corners. Rudene Re, MD, <Dictator> Zollie Scale MD ELECTRONICALLY SIGNED 05/04/2012 22:53

## 2014-07-15 NOTE — Consult Note (Signed)
Pt CC: alcoholism.  Pt with UTI of  greater than 100,000 GNR.  Will start Cipro pending sensitivities.  Pt oriented to person and place.  No new complaints.  Electronic Signatures: Scot JunElliott, Robert T (MD)  (Signed on 11-Feb-14 17:57)  Authored  Last Updated: 11-Feb-14 17:57 by Scot JunElliott, Robert T (MD)

## 2014-07-15 NOTE — Consult Note (Signed)
Pt CC: alcoholism.  She had mildly elevated AST and ALT, she has ketotic breath.  May benefit from dextrose in iv and multivits oral or iv.  Last alcohol per husband was yesterday morning.  US with enlarged fatty liver.  Lipase borderline and may not represent true pancreatitis.  Hgb of 10 and WBC of 3.7 may represent bone marrow suppression from alcohol.  May or may not need hepatology consult.   Electronic Signatures: Scot JunElliott, Trevionne Advani T (MD)  (Signed on 10-Feb-14 19:38)  Authored  Last Updated: 10-Feb-14 19:38 by Scot JunElliott, Kemara Quigley T (MD)

## 2014-07-15 NOTE — Consult Note (Signed)
Pt CC: alcohol withdrawal, some increased fatigue, weakness.  Na down to 127, K 3.3, creat 0.47, WBC 3.5 Hgb 9.1, plt ct 198.  UTI on Levaquin which it is sensitive to.  VSS pulse 109-125.  chest with exp wheeze, looks less SOB today.  Abd not tender, slight distention.  No new recommendations,.  Electronic Signatures: Scot JunElliott, Naylin Burkle T (MD)  (Signed on 13-Feb-14 11:51)  Authored  Last Updated: 13-Feb-14 11:51 by Scot JunElliott, Bonnie Roig T (MD)

## 2014-07-15 NOTE — Discharge Summary (Signed)
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    Chief Complaint/History of Present Illness see h and p   Thyroid:  20-Feb-14 18:29   Thyroid Stimulating Hormone 1.11 (0.45-4.50 (International Unit)  ----------------------- Pregnant patients have  different reference  ranges for TSH:  - - - - - - - - - -  Pregnant, first trimetser:  0.36 - 2.50 uIU/mL)  Hepatic:  09-Feb-14 15:15   Bilirubin, Total 1.0  Alkaline Phosphatase  155  SGPT (ALT)  213  SGOT (AST)  459  Total Protein, Serum 7.3  Albumin, Serum 3.7  11-Feb-14 03:42   Bilirubin, Total  1.2  Alkaline Phosphatase  138  SGPT (ALT)  156  SGOT (AST)  281  Total Protein, Serum 6.5  Albumin, Serum  3.3  Bilirubin, Direct  0.7 (Result(s) reported on 05 May 2012 at 04:27AM.)  12-Feb-14 05:22   Bilirubin, Total  1.2  Alkaline Phosphatase  139  SGPT (ALT)  161  SGOT (AST)  280  Total Protein, Serum 6.9  Albumin, Serum  3.3  Bilirubin, Direct  0.6 (Result(s) reported on 06 May 2012 at [facility].)  14-Feb-14 05:27   Bilirubin, Total  1.3  Alkaline Phosphatase 126  SGPT (ALT)  139  SGOT (AST)  170  Total Protein, Serum 6.8  Albumin, Serum  3.3  Bilirubin, Direct  0.8 (Result(s) reported on 08 May 2012 at 06:16AM.)  17-Feb-14 04:32   Bilirubin, Total  1.2  Alkaline Phosphatase 114  SGPT (ALT)  137  SGOT (AST)  122  Total Protein, Serum 6.4  Albumin, Serum  3.3  Bilirubin, Direct  0.6 (Result(s) reported on 11 May 2012 at [facility].)  19-Feb-14 04:48   Bilirubin, Total  1.1  Alkaline Phosphatase 94  SGPT (ALT)  108  SGOT (AST)  75  Total Protein, Serum  6.3   Albumin, Serum  3.2  Bilirubin, Direct  0.4 (Result(s) reported on 13 May 2012 at 06:11AM.)  22-Feb-14 04:42   Bilirubin, Total 0.6  Alkaline Phosphatase 94  SGPT (ALT)  91  SGOT (AST)  64  Total Protein, Serum  5.5  Albumin, Serum  2.8  Bilirubin, Direct  0.3 (Result(s) reported on 16 May 2012 at 06:13AM.)  23-Feb-14 04:35   Bilirubin, Total 0.5  Alkaline Phosphatase 105  SGPT (ALT)  92  SGOT (AST)  56  Total Protein, Serum  5.8  Albumin, Serum  2.9  24-Feb-14 04:43   Bilirubin, Total 0.5  Alkaline Phosphatase 98  SGPT (ALT)  80  SGOT (AST)  44  Total Protein, Serum  5.7  Albumin, Serum  3.0  LabUnknown:  10-Feb-14 18:40   CEFOXITIN S  Routine Micro:  10-Feb-14 18:40   Organism Name PROTEUS MIRABILIS  Organism Quantity >100,000 CFU/ML  Nitrofurantoin Sensitivity R  Cefazolin Sensitivity S  Ampicillin Sensitivity S  Ceftriaxone Sensitivity S  Ciprofloxacin Sensitivity S  Gentamicin Sensitivity S  Imipenem Sensitivity S  Trimethoprim/Sulfamethoxazole Sensitivty S  Lefofloxacin Sensitivity S  Micro Text Report URINE CULTURE   ORGANISM 1                >100,000 CFU/ML PROTEUS MIRABILIS  COMMENT                   WITH MIXED-BACTERIAL-ORGANISMS   ANTIBIOTIC                    ORG#1     AMPICILLIN                    S         CEFAZOLIN  S         CEFOXITIN                     S         CEFTRIAXONE                   S         CIPROFLOXACIN                 S         GENTAMICIN                    S         IMIPENEM                      S         LEVOFLOXACIN                  S       NITROFURANTOIN                R         TRIMETHOPRIM/SULFAMETHOXAZOLE S  Specimen Source CLEAN CATCH  Organism 1 >100,000 CFU/ML PROTEUS MIRABILIS  Culture Comment ID TO FOLLOW SENSITIVITIES TO FOLLOW  Culture Comment . WITH MIXED-BACTERIAL-ORGANISMS  Result(s) reported on 06 May 2012 at 11:49AM.  General Ref:  20-Feb-14 18:29   Cortisol, Serum RIA ========== TEST NAME ==========   ========= RESULTS =========  = REFERENCE RANGE =  CORTISOL  Cortisol Cortisol                        [   7.6 ug/dL            ]          2.3-19.4                                        Cortisol AM         6.2 - 19.4                                        Cortisol PM         2.3 - 11.9               [facility]            No: [id number]           [address], [place], [place] [address]           Lindon Romp, MD         [phone]   Result(s) reported on 15 May 2012 at 03:48AM.  Cardiology:  09-Feb-14 15:19   Ventricular Rate 88  Atrial Rate 88  P-R Interval 166  QRS Duration 106  QT 366  QTc 442  P Axis 76  R Axis 80  T Axis 62  ECG interpretation Normal sinus rhythm Normal ECG When compared with ECG of 19-Jun-2000 09:31, QRS duration has increased ----------unconfirmed---------- Confirmed by OVERREAD, NOT ([id number]), editor PEARSON, BARBARA ([id number]) on 05/04/2012 12:50:42 PM  Routine Chem:  09-Feb-14 15:15   Glucose, Serum 73  BUN  6  Creatinine (comp)  0.59  Sodium, Serum  134  Potassium, Serum 3.7  Chloride, Serum  94  CO2, Serum 25  Calcium (Total), Serum 8.6  Osmolality (calc) 264  eGFR (African American) >60  eGFR (Non-African American) >60 (eGFR values <2mL/min/1.73 m2 may be an indication of chronic kidney disease (CKD). Calculated eGFR is useful in patients with stable renal function. The eGFR calculation will not be reliable in acutely ill patients when serum creatinine is changing rapidly. It is not useful in  patients on dialysis. The eGFR calculation may not be applicable to patients at the low and high extremes of body sizes, pregnant women, and vegetarians.)  Anion Gap 15  Lipase  680 (Result(s) reported on 03 May 2012 at 07:08PM.)  10-Feb-14 04:23   Lipase  680 (Result(s) reported on 04 May 2012 at 05:52AM.)  11-Feb-14 03:42   Glucose, Serum  139  BUN  3  Creatinine (comp)  0.49  Sodium, Serum  132  Potassium, Serum  3.0   Chloride, Serum  93  CO2, Serum 23  Calcium (Total), Serum  7.5  Osmolality (calc) 263  eGFR (African American) >60  eGFR (Non-African American) >60 (eGFR values <52mL/min/1.73 m2 may be an indication of chronic kidney disease (CKD). Calculated eGFR is useful in patients with stable renal function. The eGFR calculation will not be reliable in acutely ill patients when serum creatinine is changing rapidly. It is not useful in  patients on dialysis. The eGFR calculation may not be applicable to patients at the low and high extremes of body sizes, pregnant women, and vegetarians.)  Anion Gap 16  Lipase  407 (Result(s) reported on 05 May 2012 at 04:27AM.)  12-Feb-14 05:22   Glucose, Serum  128  BUN  5  Creatinine (comp)  0.48  Sodium, Serum  128  Potassium, Serum 3.7  Chloride, Serum  90  CO2, Serum 25  Calcium (Total), Serum  8.0  Osmolality (calc) 256  eGFR (African American) >60  eGFR (Non-African American) >60 (eGFR values <37mL/min/1.73 m2 may be an indication of chronic kidney disease (CKD). Calculated eGFR is useful in patients with stable renal function. The eGFR calculation will not be reliable in acutely ill patients when serum creatinine is changing rapidly. It is not useful in  patients on dialysis. The eGFR calculation may not be applicable to patients at the low and high extremes of body sizes, pregnant women, and vegetarians.)  Anion Gap 13  13-Feb-14 05:52   Glucose, Serum  155  BUN  6  Creatinine (comp)  0.47  Sodium, Serum  127  Potassium, Serum  3.3  Chloride, Serum  88  CO2, Serum 25  Calcium (Total), Serum  8.0  Osmolality (calc) 256  eGFR (African American) >60  eGFR (Non-African American) >60 (eGFR values <51mL/min/1.73 m2 may be an indication of chronic kidney disease (CKD). Calculated eGFR is useful in patients with stable renal function. The eGFR calculation will not be reliable in acutely ill patients when serum creatinine is changing  rapidly. It is not useful in  patients on dialysis. The eGFR calculation  may not be applicable to patients at the low and high extremes of body sizes, pregnant women, and vegetarians.)  Anion Gap 14  14-Feb-14 05:27   Glucose, Serum  141  BUN 8  Creatinine (comp)  0.52  Sodium, Serum  129  Potassium, Serum  3.3  Chloride, Serum  90  CO2, Serum 25  Calcium (Total), Serum  8.1  Osmolality (calc) 260  eGFR (African American) >60  eGFR (Non-African American) >60 (eGFR values <92mL/min/1.73 m2 may be an indication of chronic kidney disease (CKD). Calculated eGFR is useful in patients with stable renal function. The eGFR calculation will not be reliable in acutely ill patients when serum creatinine is changing rapidly. It is not useful in  patients on dialysis. The eGFR calculation may not be applicable to patients at the low and high extremes of body sizes, pregnant women, and vegetarians.)  Anion Gap 14  15-Feb-14 04:18   Glucose, Serum  160  BUN 8  Creatinine (comp)  0.47  Sodium, Serum  131  Potassium, Serum  3.3  Chloride, Serum  95  CO2, Serum 25  Calcium (Total), Serum  7.7  Osmolality (calc) 264  eGFR (African American) >60  eGFR (Non-African American) >60 (eGFR values <77mL/min/1.73 m2 may be an indication of chronic kidney disease (CKD). Calculated eGFR is useful in patients with stable renal function. The eGFR calculation will not be reliable in acutely ill patients when serum creatinine is changing rapidly. It is not useful in  patients on dialysis. The eGFR calculation may not be applicable to patients at the low and high extremes of body sizes, pregnant women, and vegetarians.)  Anion Gap 11  Magnesium, Serum  1.1 (1.8-2.4 THERAPEUTIC RANGE: 4-7 mg/dL TOXIC: > 10 mg/dL  -----------------------)  16-Feb-14 04:27   Glucose, Serum  112  BUN 9  Creatinine (comp) 0.63  Sodium, Serum  130  Potassium, Serum 3.6  Chloride, Serum  95  CO2, Serum 27  Calcium  (Total), Serum  7.7  Osmolality (calc) 260  eGFR (African American) >60  eGFR (Non-African American) >60 (eGFR values <42mL/min/1.73 m2 may be an indication of chronic kidney disease (CKD). Calculated eGFR is useful in patients with stable renal function. The eGFR calculation will not be reliable in acutely ill patients when serum creatinine is changing rapidly. It is not useful in  patients on dialysis. The eGFR calculation may not be applicable to patients at the low and high extremes of body sizes, pregnant women, and vegetarians.)  Anion Gap 8  Magnesium, Serum 1.9 (1.8-2.4 THERAPEUTIC RANGE: 4-7 mg/dL TOXIC: > 10 mg/dL  -----------------------)  17-Feb-14 04:32   Glucose, Serum 90  BUN 9  Creatinine (comp)  0.58  Sodium, Serum  127  Potassium, Serum 4.0  Chloride, Serum  91  CO2, Serum 25  Calcium (Total), Serum 8.7  Osmolality (calc) 253  eGFR (African American) >60  eGFR (Non-African American) >60 (eGFR values <81mL/min/1.73 m2 may be an indication of chronic kidney disease (CKD). Calculated eGFR is useful in patients with stable renal function. The eGFR calculation will not be reliable in acutely ill patients when serum creatinine is changing rapidly. It is not useful in  patients on dialysis. The eGFR calculation may not be applicable to patients at the low and high extremes of body sizes, pregnant women, and vegetarians.)  Anion Gap 11  19-Feb-14 04:48   Glucose, Serum 86  BUN 10  Creatinine (comp) 0.65  Sodium, Serum  124  Potassium, Serum 3.7  Chloride,  Serum  87  CO2, Serum 25  Calcium (Total), Serum 8.9  Osmolality (calc) 248  eGFR (African American) >60  eGFR (Non-African American) >60 (eGFR values <17mL/min/1.73 m2 may be an indication of chronic kidney disease (CKD). Calculated eGFR is useful in patients with stable renal function. The eGFR calculation will not be reliable in acutely ill patients when serum creatinine is changing rapidly. It is not  useful in  patients on dialysis. The eGFR calculation may not be applicable to patients at the low and high extremes of body sizes, pregnant women, and vegetarians.)  Anion Gap 12    14:51   Glucose, Serum  139  BUN 12  Creatinine (comp) 0.73  Sodium, Serum  123  Potassium, Serum 4.2  Chloride, Serum  88  CO2, Serum 25  Calcium (Total), Serum 9.4  Osmolality (calc) 250  eGFR (African American) >60  eGFR (Non-African American) >60 (eGFR values <19mL/min/1.73 m2 may be an indication of chronic kidney disease (CKD). Calculated eGFR is useful in patients with stable renal function. The eGFR calculation will not be reliable in acutely ill patients when serum creatinine is changing rapidly. It is not useful in  patients on dialysis. The eGFR calculation may not be applicable to patients at the low and high extremes of body sizes, pregnant women, and vegetarians.)  Anion Gap 10  20-Feb-14 04:55   Glucose, Serum 83  BUN 13  Creatinine (comp) 0.78  Sodium, Serum  126  Potassium, Serum 3.8  Chloride, Serum  91  CO2, Serum 23  Calcium (Total), Serum 9.1  Osmolality (calc) 253  eGFR (African American) >60  eGFR (Non-African American) >60 (eGFR values <4mL/min/1.73 m2 may be an indication of chronic kidney disease (CKD). Calculated eGFR is useful in patients with stable renal function. The eGFR calculation will not be reliable in acutely ill patients when serum creatinine is changing rapidly. It is not useful in  patients on dialysis. The eGFR calculation may not be applicable to patients at the low and high extremes of body sizes, pregnant women, and vegetarians.)  Anion Gap 12    18:29   Osmolality, Blood  262 (Result(s) reported on 14 May 2012 at 07:41PM.)  Result Comment Osmolality - RESULTS VERIFIED BY REPEAT TESTING.  - NOTIFIED OF CRITICAL VALUE  - c/Phyllis King $RemoveBefore'@[email or web address]$ :36 05-14-12 by [place].  Result(s) reported on 14 May 2012 at 07:41PM.  Uric  Acid, Serum 3.4 (Result(s) reported on 14 May 2012 at 07:29PM.)  21-Feb-14 04:48   Glucose, Serum 88  BUN 14  Creatinine (comp) 0.79  Sodium, Serum  129  Potassium, Serum 4.9  Chloride, Serum  97  CO2, Serum 24  Calcium (Total), Serum 9.0  Osmolality (calc) 259  eGFR (African American) >60  eGFR (Non-African American) >60 (eGFR values <63mL/min/1.73 m2 may be an indication of chronic kidney disease (CKD). Calculated eGFR is useful in patients with stable renal function. The eGFR calculation will not be reliable in acutely ill patients when serum creatinine is changing rapidly. It is not useful in  patients on dialysis. The eGFR calculation may not be applicable to patients at the low and high extremes of body sizes, pregnant women, and vegetarians.)  Anion Gap 8  22-Feb-14 04:42   Glucose, Serum 92  BUN 11  Creatinine (comp) 0.77  Sodium, Serum  133  Potassium, Serum 4.0  Chloride, Serum 98  CO2, Serum 24  Calcium (Total), Serum 8.6  Osmolality (calc) 265  eGFR (African American) >  60  eGFR (Non-African American) >60 (eGFR values <63mL/min/1.73 m2 may be an indication of chronic kidney disease (CKD). Calculated eGFR is useful in patients with stable renal function. The eGFR calculation will not be reliable in acutely ill patients when serum creatinine is changing rapidly. It is not useful in  patients on dialysis. The eGFR calculation may not be applicable to patients at the low and high extremes of body sizes, pregnant women, and vegetarians.)  Anion Gap 11  23-Feb-14 04:35   Glucose, Serum  107  BUN 13  Creatinine (comp) 0.70  Sodium, Serum  128  Potassium, Serum 3.8  Chloride, Serum  93  CO2, Serum 24  Calcium (Total), Serum 8.7  Osmolality (calc) 258  eGFR (African American) >60  eGFR (Non-African American) >60 (eGFR values <70mL/min/1.73 m2 may be an indication of chronic kidney disease (CKD). Calculated eGFR is useful in patients with stable renal  function. The eGFR calculation will not be reliable in acutely ill patients when serum creatinine is changing rapidly. It is not useful in  patients on dialysis. The eGFR calculation may not be applicable to patients at the low and high extremes of body sizes, pregnant women, and vegetarians.)  Anion Gap 11  Lipase 246 (Result(s) reported on 17 May 2012 at 05:49AM.)  24-Feb-14 04:43   Glucose, Serum 97  BUN 11  Creatinine (comp) 0.67  Sodium, Serum  129  Potassium, Serum 3.8  Chloride, Serum  95  CO2, Serum 25  Calcium (Total), Serum 8.6  Osmolality (calc) 258  eGFR (African American) >60  eGFR (Non-African American) >60 (eGFR values <65mL/min/1.73 m2 may be an indication of chronic kidney disease (CKD). Calculated eGFR is useful in patients with stable renal function. The eGFR calculation will not be reliable in acutely ill patients when serum creatinine is changing rapidly. It is not useful in  patients on dialysis. The eGFR calculation may not be applicable to patients at the low and high extremes of body sizes, pregnant women, and vegetarians.)  Anion Gap 9  Magnesium, Serum  1.1 (1.8-2.4 THERAPEUTIC RANGE: 4-7 mg/dL TOXIC: > 10 mg/dL  -----------------------)  Misc Urine Chem:  20-Feb-14 09:57   Osmolality, Urine Random 220 (50-1400 300-900 mOsm/kg   with avg Fluid Intake > 850 mOsm/kg  with Fluid Restriction)  Chloride, Urine Random  36 (Result(s) reported on 14 May 2012 at 11:00AM.)  Sodium, Urine Random 45 (Result(s) reported on 14 May 2012 at 11:00AM.)  Cardiac:  09-Feb-14 15:15   CK, Total 51  CPK-MB, Serum 1.4 (Result(s) reported on 03 May 2012 at 04:01PM.)  Troponin I < 0.02 (0.00-0.05 0.05 ng/mL or less: NEGATIVE  Repeat testing in 3-6 hrs  if clinically indicated. >0.05 ng/mL: POTENTIAL  MYOCARDIAL INJURY. Repeat  testing in 3-6 hrs if  clinically indicated. NOTE: An increase or decrease  of 30% or more on serial  testing suggests a  clinically  important change)  Routine UA:  09-Feb-14 18:40   Color (UA) Amber  Clarity (UA) Clear  Glucose (UA) Negative  Bilirubin (UA) 1+  Ketones (UA) 2+  Specific Gravity (UA) 1.025  Blood (UA) Negative  pH (UA) 6.0  Protein (UA) 25 mg/dL  Nitrite (UA) Negative  Leukocyte Esterase (UA) 1+ (Result(s) reported on 03 May 2012 at 06:56PM.)  RBC (UA) 4 /HPF  WBC (UA) 19 /HPF  Bacteria (UA) NONE SEEN  Epithelial Cells (UA) 9 /HPF  Transitional Epithelial (UA) <1 /HPF  Mucous (UA) PRESENT (Result(s) reported on 03 May 2012 at  06:56PM.)  Routine Hem:  09-Feb-14 15:15   WBC (CBC) 3.7  RBC (CBC)  3.02  Hemoglobin (CBC)  10.3  Hematocrit (CBC)  31.1  Platelet Count (CBC) 181 (Result(s) reported on 03 May 2012 at 03:33PM.)  MCV  103  MCH  34.2  MCHC 33.2  RDW  17.9  11-Feb-14 03:42   WBC (CBC) 3.9  RBC (CBC)  2.65  Hemoglobin (CBC)  9.4  Hematocrit (CBC)  27.5  Platelet Count (CBC)  143  MCV  104  MCH  35.4  MCHC 34.1  RDW  17.0  Neutrophil % 79.1  Lymphocyte % 11.3  Monocyte % 7.7  Eosinophil % 0.6  Basophil % 1.3  Neutrophil # 3.0  Lymphocyte #  0.4  Monocyte # 0.3  Eosinophil # 0.0  Basophil # 0.1 (Result(s) reported on 05 May 2012 at 04:23AM.)  12-Feb-14 05:22   WBC (CBC) 3.6  RBC (CBC)  2.72  Hemoglobin (CBC)  9.1  Hematocrit (CBC)  28.4  Platelet Count (CBC) 165  MCV  105  MCH 33.5  MCHC 32.1  RDW  17.6  Neutrophil % 74.9  Lymphocyte % 16.0  Monocyte % 8.5  Eosinophil % 0.1  Basophil % 0.5  Neutrophil # 2.7  Lymphocyte #  0.6  Monocyte # 0.3  Eosinophil # 0.0  Basophil # 0.0 (Result(s) reported on 06 May 2012 at 05:56AM.)  13-Feb-14 05:52   WBC (CBC)  3.5  RBC (CBC)  2.66  Hemoglobin (CBC)  9.1  Hematocrit (CBC)  27.6  Platelet Count (CBC) 198  MCV  104  MCH  34.2  MCHC 32.9  RDW  17.1  Neutrophil % 73.7  Lymphocyte % 12.6  Monocyte % 13.4  Eosinophil % 0.0  Basophil % 0.3  Neutrophil # 2.6  Lymphocyte #  0.4  Monocyte # 0.5  Eosinophil #  0.0  Basophil # 0.0 (Result(s) reported on 07 May 2012 at 06:51AM.)  14-Feb-14 05:27   WBC (CBC) 4.0  RBC (CBC)  2.72  Hemoglobin (CBC)  9.2  Hematocrit (CBC)  28.0  Platelet Count (CBC) 232  MCV  103  MCH 33.9  MCHC 32.9  RDW  17.1  Neutrophil % 71.4  Lymphocyte % 11.0  Monocyte % 17.4  Eosinophil % 0.0  Basophil % 0.2  Neutrophil # 2.9  Lymphocyte #  0.4  Monocyte # 0.7  Eosinophil # 0.0  Basophil # 0.0 (Result(s) reported on 08 May 2012 at 06:16AM.)  15-Feb-14 04:18   WBC (CBC)  3.5  RBC (CBC)  2.62  Hemoglobin (CBC)  9.0  Hematocrit (CBC)  26.9  Platelet Count (CBC) 250  MCV  103  MCH  34.5  MCHC 33.5  RDW  17.6  Neutrophil % 69.7  Lymphocyte % 10.7  Monocyte % 19.6  Eosinophil % 0.0  Basophil % 0.0  Neutrophil # 2.4  Lymphocyte #  0.4  Monocyte # 0.7  Eosinophil # 0.0  Basophil # 0.0 (Result(s) reported on 09 May 2012 at 05:35AM.)  16-Feb-14 04:27   WBC (CBC) 4.5  RBC (CBC)  2.62  Hemoglobin (CBC)  8.5  Hematocrit (CBC)  26.7  Platelet Count (CBC) 282  MCV  102  MCH 32.4  MCHC  31.8  RDW  17.9  Neutrophil % 55.3  Lymphocyte % 21.1  Monocyte % 23.2  Eosinophil % 0.2  Basophil % 0.2  Neutrophil # 2.5  Lymphocyte #  0.9  Monocyte #  1.0  Eosinophil # 0.0  Basophil #  0.0 (Result(s) reported on 10 May 2012 at 05:46AM.)  17-Feb-14 04:32   WBC (CBC) 4.7  RBC (CBC)  2.91  Hemoglobin (CBC)  9.8  Hematocrit (CBC)  29.6  Platelet Count (CBC) 346  MCV  102  MCH 33.6  MCHC 33.0  RDW  17.9  Neutrophil % 53.9  Lymphocyte % 24.7  Monocyte % 20.6  Eosinophil % 0.7  Basophil % 0.1  Neutrophil # 2.6  Lymphocyte # 1.2  Monocyte #  1.0  Eosinophil # 0.0  Basophil # 0.0 (Result(s) reported on 11 May 2012 at [facility]  19-Feb-14 04:48   WBC (CBC) 5.2  RBC (CBC)  2.96  Hemoglobin (CBC)  9.9  Hematocrit (CBC)  29.6  Platelet Count (CBC) 395  MCV 100  MCH 33.3  MCHC 33.3  RDW  18.5  Neutrophil % 52.6  Lymphocyte % 24.7  Monocyte % 21.7   Eosinophil % 0.9  Basophil % 0.1  Neutrophil # 2.7  Lymphocyte # 1.3  Monocyte #  1.1  Eosinophil # 0.0  Basophil # 0.0 (Result(s) reported on 13 May 2012 at 06:29AM.)  20-Feb-14 04:55   WBC (CBC) 6.1  RBC (CBC)  2.89  Hemoglobin (CBC)  9.5  Hematocrit (CBC)  28.8  Platelet Count (CBC) 369  MCV 100  MCH 33.0  MCHC 33.1  RDW  18.6  Neutrophil % 56.5  Lymphocyte % 23.2  Monocyte % 19.3  Eosinophil % 0.7  Basophil % 0.3  Neutrophil # 3.4  Lymphocyte # 1.4  Monocyte #  1.2  Eosinophil # 0.0  Basophil # 0.0 (Result(s) reported on 14 May 2012 at 05:53AM.)  21-Feb-14 04:48   WBC (CBC) 6.8  RBC (CBC)  2.73  Hemoglobin (CBC)  9.1  Hematocrit (CBC)  27.4  Platelet Count (CBC) 366  MCV 100  MCH 33.3  MCHC 33.2  RDW  18.7  Neutrophil % 62.2  Lymphocyte % 20.8  Monocyte % 16.0  Eosinophil % 0.7  Basophil % 0.3  Neutrophil # 4.3  Lymphocyte # 1.4  Monocyte #  1.1  Eosinophil # 0.0  Basophil # 0.0 (Result(s) reported on 15 May 2012 at 05:40AM.)  22-Feb-14 04:42   WBC (CBC) 7.5  RBC (CBC)  2.58  Hemoglobin (CBC)  9.2  Hematocrit (CBC)  26.0  Platelet Count (CBC) 338  MCV  101  MCH  35.7  MCHC 35.6  RDW  18.1  Neutrophil % 63.9  Lymphocyte % 22.7  Monocyte % 11.9  Eosinophil % 1.1  Basophil % 0.4  Neutrophil # 4.8  Lymphocyte # 1.7  Monocyte # 0.9  Eosinophil # 0.1  Basophil # 0.0 (Result(s) reported on 16 May 2012 at 05:49AM.)  23-Feb-14 04:35   WBC (CBC) 8.3  RBC (CBC)  2.71  Hemoglobin (CBC)  9.0  Hematocrit (CBC)  27.2  Platelet Count (CBC) 327  MCV 100  MCH 33.2  MCHC 33.1  RDW  18.4  Neutrophil % 69.2  Lymphocyte % 19.4  Monocyte % 9.7  Eosinophil % 1.1  Basophil % 0.6  Neutrophil # 5.8  Lymphocyte # 1.6  Monocyte # 0.8  Eosinophil # 0.1  Basophil # 0.0 (Result(s) reported on 17 May 2012 at 05:50AM.)  24-Feb-14 04:43   WBC (CBC) 9.1  RBC (CBC)  2.76  Hemoglobin (CBC)  9.0  Hematocrit (CBC)  27.8  Platelet Count (CBC) 323  MCV  101   MCH 32.7  MCHC 32.5  RDW  18.7  Neutrophil % 66.3  Lymphocyte %  22.1  Monocyte % 10.0  Eosinophil % 0.9  Basophil % 0.7  Neutrophil # 6.1  Lymphocyte # 2.0  Monocyte # 0.9  Eosinophil # 0.1  Basophil # 0.1 (Result(s) reported on 18 May 2012 at 06:05AM.)   PERTINENT RADIOLOGY STUDIES: XRay:    17-Feb-14 10:05, Chest PA and Lateral  Chest PA and Lateral   REASON FOR EXAM:    copd  COMMENTS:       PROCEDURE: DXR - DXR CHEST PA (OR AP) AND LATERAL  - May 11 2012 10:05AM     RESULT: The cardiac silhouette is enlarged. The lungs are mildly   hyperinflated. There is some thickening in the major fissures. There is   no consolidation, effusion or pneumothorax evident. The bony structures   are osteopenic.    IMPRESSION:   1. Hyperinflation. Mild cardiomegaly.    Dictation Site: 2    Verified By: Sundra Aland, M.D., MD  [place]:    31-Jan-14 09:34, US Abdomen General Survey  US Abdomen General Survey   REASON FOR EXAM:    Epigastric abdominal pain  COMMENTS:       PROCEDURE: [place]  - US ABDOMEN GENERAL SURVEY  - Apr 24 2012  9:34AM     RESULT: The liver is mildly enlarged and exhibits somewhat increased   echotexture suggesting fatty infiltration. It is difficult to penetrate   with the ultrasound beam. Portal venous flow is normal in direction   toward the liver. The gallbladder is adequately distended with no   evidence of stones, wall thickening, or pericholecystic fluid. There is   no positivesonographic Murphy's sign. The common bile duct is normal at   3.9 mm in diameter.    Bowel gas obscures the pancreas. The spleen, abdominal aorta, and   inferior vena cava exhibit no acute abnormalities. The kidneys are normal     in contour and size and exhibit no evidence of obstruction.    IMPRESSION:   1. The gallbladder exhibits no evidence of stones or acute inflammation.  2. The liver is subjectively enlarged its echotexture is increased   suggesting fatty  infiltration. No focal mass or ductal dilation is   demonstrated.  3. The common bile duct and kidneys exhibit no acute abnormality.     Dictation Site: 2        Verified By: DAVID A. Martinique, M.D., MD    09-Feb-14 20:51, US Abdomen Limited Survey  US Abdomen Limited Survey   REASON FOR EXAM:    abd pain  COMMENTS:   Body Site: GB and Fossa, CBD, Head of Pancreas    PROCEDURE: [place]  - US ABDOMEN LIMITED SURVEY  - May 03 2012  8:51PM     RESULT: Limited right upper quadrant abdominal sonogram is performed. The   liver lengthmeasures 20.22 cm. The hepatic echotexture is slightly   coarse and mildly increased which can represent fatty infiltration. No   gallstones are evident. The gallbladder wall thickness is 1.4 mm. The   common bile duct diameter is measured at 4.5 mm.Portal venous flow is   normal. There is no abnormal fluid collection. There is reported negative   sonographic Murphy's sign. No intrahepatic biliary ductal dilation is   evident.    The pancreas could not be visualized completely because of overlying     bowel gas. No gross pancreatic abnormality is evident.    IMPRESSION:   1. Limited right upper quadrant abdominal sonogram showing hepatomegaly   and  probable hepatic fatty infiltration without an acute abnormality.    Thank you for the opportunity to contribute to the care of your patient.     Dictation Site: 1        Verified By: Elveria Royals, M.D., MD  LabUnknown:    17-Feb-14 10:05, Chest PA and Lateral  PACS Image   CT:    17-Feb-14 10:04, CT Chest for Pulm Embolism With Contrast  CT Chest for Pulm Embolism With Contrast   REASON FOR EXAM:    sob  COMMENTS:       PROCEDURE: CT  - CT CHEST (FOR PE) W  - May 11 2012 10:04AM     RESULT: Axial CT scanning was performed through the chest with   reconstructions at 3 mm intervals and slice thicknesses following   intravenous administration of 85 cc of Isovue-370. Review of multiplanar    reconstructed images was performed separately on the VIA monitor.   Comparison is made to the study of 16 December 2011.    Contrast within the pulmonary arterial tree is normal in appearance.   There are no filling defects to suggest an acute pulmonary embolism. The   caliber of the thoracic aorta is normal. There is no evidence of a false   lumen. The cardiac chambers are normal in size. There is a moderate sized     hiatal hernia. There is no bulky mediastinal or hilar lymphadenopathy.   There is no pleural nor pericardial effusion. There is mild pleural   thickening posteriorly in the left hemithorax.    At lung window settings there are mild emphysematous changes. There are   no pulmonary parenchymal masses. There is no alveolar infiltrate or   significant atelectasis.    Within the upper abdomen the density of the liver is markedly decreased   consistent with fatty infiltration. The spleen is not enlarged. There are   noadrenal masses. The thoracic vertebral bodies are preserved in height.   No acute rib abnormality is demonstrated.    IMPRESSION:   1. There is no evidence of an acute pulmonary embolism nor of acute     thoracic aortic pathology.  2. There is no evidence of pneumonia nor significant atelectasis. There   are findings consistent with COPD. There is no pleural nor pericardial   effusion.  3. There is a moderate sized hiatal hernia. There is decreased density   throughout the visualized portions of the liver consistent with fatty   infiltration.     Dictation Site: 1        Verified By: DAVID A. [place], M.D., MD   [facility] Course:  [facility] Course Long course with prolonged withdrawal and slow to improve appetite with pancreatitis, hepatitis. Needed steriods with wheezing and hypoxia, neg ct noted. Na was slow to improve as well, iv fluid and better po finally did without tolvaptan which was considered. She is planning intensive outpatient etoh rehab in  [place], more motivated than she had been. Note it took 35 min for all dc tasks today and see multipl elabs and xrays.   Condition on Discharge Good   DISCHARGE INSTRUCTIONS HOME MEDS:  Medication Reconciliation: Patient's Home Medications at Discharge:     Medication Instructions  advair diskus 250 mcg-50 mcg inhalation powder  1 puff(s) inhaled 2 times a day   omeprazole 40 mg oral delayed release capsule  1 cap(s) orally 2 times a day   proair hfa cfc free 90 mcg/inh inhalation aerosol  2 puff(s) inhaled every 4 hours as needed while awake with spacer.   tiotropium 18 mcg inhalation capsule  Inhale 1 cap(s) via handihaler once a day.   multivitamin  1 tab(s) orally once a day   folic acid 0.4 mg oral tablet  1 tab(s) orally once a day   fluticasone-salmeterol 250 mcg-50 mcg inhalation powder  1 puff(s) inhaled 2 times a day   multivitamin  1 tab(s) orally once a day   albuterol cfc free 90 mcg/inh inhalation aerosol   inhaled    tiotropium 18 mcg inhalation capsule  1 cap(s) inhaled once a day   sertraline 50 mg oral tablet  1 tab(s) orally once a day     Physician's Instructions:  Diet Regular   Activity Limitations As tolerated   Return to Work Not Applicable   Time frame for Follow Up Appointment per [employer]: Kirk Ruths (MD)  (Signed [phone] 08:09)  Authored: ADMISSION DATE AND DIAGNOSIS, CHIEF COMPLAINT/HPI, PERTINENT LABS, PERTINENT RADIOLOGY STUDIES, [place] COURSE, DISCHARGE INSTRUCTIONS HOME MEDS, PATIENT INSTRUCTIONS   Last Updated: 24-Feb-14 08:09 by Kirk Ruths (MD)

## 2014-07-15 NOTE — Consult Note (Signed)
PATIENT NAME:  MIRABELLA, HILARIO MR#:  [id number] DATE OF BIRTH:  06-Mar-1941  DATE OF CONSULTATION:  05/04/2012  REFERRING PHYSICIAN:  Dr. Einar Crow.   CONSULTING PHYSICIAN:  Cuong Moorman B. Zaylei Mullane, MD  REASON FOR CONSULTATION: To evaluate a patient with alcoholism.   IDENTIFYING DATA: Ms. Firkus is a 74 year old female with alcoholism.   CHIEF COMPLAINT: "I need help."   History Of Present Illness: Ms. Scholes has been drinking heavily for the past 25 years. She is uncertain how much alcohol she drinks but it is probably at least a fifth of liquor everyday.  She never leaves the house and alcohol is provided by Jonny Ruiz, her boyfriend, who has not been able to refuse the patient. She was admitted to [facility] for alcoholic pancreatitis. I was asked to evaluate the patient for alcohol detox and rehab. The patient has been hospitalized at [facility] before, never ever she was interested in rehab.  She has two loving sons, who unfortunately live away, one in [place] and one in [place], who have been very concerned about her health.  But in spite of encouragement, the patient never wanted any rehab and rarely she comes for detox. She denies any symptoms of depression but feels guilty and hopeless and sad about her inability to stop. The patient denies mood symptoms, anxiety, psychosis, she denies other than alcohol substance use.   PAST PSYCHIATRIC HISTORY: She has been in alcohol detox several times. She went to [facility] for rehab. She is not impressed with the program. Following discharge from the hospital, she had appointments with psychiatrists in the area but never kept them. She was prescribed Zoloft by Dr. Maryruth Bun, at some point, but never took the medication. 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  There was no suicide attempt.   FAMILY PSYCHIATRIC HISTORY: Both parents with alcoholism. Her brother and sister also drink  excessively.   PAST MEDICAL HISTORY: Alcoholic pancreatitis, chronic obstructive pulmonary disease, hypertension, gastroesophageal reflux disease, two back surgeries.   MEDICATIONS ON ADMISSION: Tiotropium 18 mcg, ProAir as needed, omeprazole 40 mg twice a day, multivitamin daily, folic acid 0.4 mg daily, Advair Diskus 250/50 twice daily.   Medications at the time of consultation: CIWA protocol, Advair Diskus 250, folic acid 1 mg daily, heparin, nitroglycerin 2% ointment, Protonix injection, Spiriva inhaler, albuterol as needed.   SOCIAL HISTORY: The patient is in a relationship with Jonny Ruiz, who is very supportive and loving but also enabling.  Her 2 adult sons live away but have been calling our local psychiatrists concerned about their mother's deterioration. The patient has some resources that would allow her to choose different than [facility] residential treatment.   Review of systems:  CONSTITUTIONAL: No fevers or chills. Positive for fatigue and loss of muscle mass.  EYES: No double or blurred vision.  EARS, NOSE, AND THROAT: No hearing loss.  RESPIRATORY: No shortness of breath or cough.  CARDIOVASCULAR: No chest pain or orthopnea.  GASTROINTESTINAL: No abdominal pain, in spite of pancreatitis.  GENITOURINARY: No incontinence or frequency.  ENDOCRINE: No heat or cold intolerance.  LYMPHATIC: No anemia or easy bruising.  INTEGUMENTARY: No acne or rash.  MUSCULOSKELETAL: No muscle or joint pain.  NEUROLOGIC: No tingling or weakness.  PSYCHIATRIC: See history of present illness for details.   PHYSICAL EXAMINATION:  VITAL SIGNS: Blood pressure 164/91, pulse 109, respirations 18, temperature 98.7.  GENERAL: This is an ill-appearing elderly female in no acute distress.  The rest of the physical examination is deferred to her primary attending.   LABORATORY DATA: Chemistries: Blood glucose 73, sodium 134, potassium 3.7, lipase 680. LFTs within normal limits except for alkaline  phosphatase 155. AST 459 and ALT 213.  Cardiac enzymes negative. CBC: White blood count 3.7, hemoglobin 10.3, hematocrit 31.1, platelets 181. Urinalysis 1+ leukocyte esterase with 19 white blood cells per field.   EKG: Normal sinus rhythm, normal EKG.   MENTAL STATUS EXAMINATION: The patient is alert and oriented to person, place, time, and situation. She is pleasant, polite, and cooperative, rather engaging. She recognizes me from a previous admission. She is in bed, wearing hospital gown. She maintains good eye contact. Her speech is soft. Mood is depressed with flat affect. Thought processing is logical with its own logic. Thought content: She denies suicidal or homicidal ideation. There are no delusions or paranoia. There are no auditory or visual hallucinations. Her cognition is difficult to assess but probably impaired. Her insight and judgment are questionable.   Suicide risk assessment: This is a patient with lifelong history of alcoholism, now with alcoholic pancreatitis, who in the past adamantly refused alcohol treatment. She is somewhat interested in treatment now.   DIAGNOSES:  AXIS I:  1.  Alcohol dependence.  2.  History of benzodiazepine dependence.  3.  History of amphetamine dependence.  4.  Substance-induced mood disorder.   AXIS II: Deferred.   AXIS III: Pancreatitis, chronic obstructive pulmonary disease, hypertension, back pain.   AXIS IV: Mental illness, substance abuse, family conflict, poor insight into her problems.   AXIS V: GAF 40.   PLAN:  1.  Please continue alcohol detox.  2.  We will transfer to psychiatry if need is for further detox.  3.  Substance abuse treatment. The [facility] not [facility]. We talked about other high-end facilities.  It seems that the patient is [facility]. There are financial resources available.  4.  I will follow up.       ____________________________ Braulio ConteJolanta B.  Jennet MaduroPucilowska, MD jbp:th D: 05/04/2012 18:26:15 ET T: 05/04/2012 21:33:17 ET JOB#: [id number]  cc: Ziyan Hillmer B. Jennet MaduroPucilowska, MD, <Dictator> Shari ProwsJOLANTA B Bonnee Zertuche MD ELECTRONICALLY SIGNED 05/25/2012 7:37

## 2014-07-17 NOTE — H&P (Signed)
PATIENT NAME:  Dor[place] Blanchard, Doris Blanchard MR#:  [id number] DATE OF BIRTH:  [birth year]  DATE OF ADMISSION:  08/09/2011  PRIMARY CARE PHYSICIAN: Dr. Einar Crow  CHIEF COMPLAINT: Unresolving pneumonia, new finding of the pulmonary nodules and alcohol withdrawal symptoms.   HISTORY OF PRESENT ILLNESS: Ms. Schiller is a 74 year old pleasant Caucasian female with history of chronic alcoholism and chronic smoking. Patient tells me that she quit smoking two months ago and since that time her health is deteriorating. She developed pneumonia and she had several antibiotics over the last few weeks without improvement then she was referred to Dr. Meredeth Ide for pulmonary consult who ordered CAT scan of the chest which was done today showing multiple pulmonary nodules suspicious for metastatic disease. The patient also was brought here for alcohol detox. She was trying to do that by herself at home but she is afraid she is going through withdrawals. She is using Ativan at home to minimize her symptoms. Patient was admitted to the hospital for further evaluation and treatment in consultation with pulmonary.    REVIEW OF SYSTEMS: CONSTITUTIONAL: Denies fever but two days ago when she tried to quit alcohol she became a little feverish. No chills. No fatigue. EYES: No blurring of vision. No double vision. ENT: No hearing impairment. No sore throat. No dysphagia. CARDIOVASCULAR: No chest pain. No shortness of breath. No edema. No syncope. RESPIRATORY: Reports cough with occasional sputum production, some chest discomfort and pain upon coughing. No hemoptysis. GASTROINTESTINAL: Reported nausea. She had one episode of vomiting yesterday morning. No abdominal pain. No diarrhea. No hematochezia. No melena. GENITOURINARY: No dysuria or frequency of urination. MUSCULOSKELETAL: No joint pain or swelling. No muscular pain or swelling. INTEGUMENTARY: No skin rash. No ulcers. NEUROLOGY: No focal weakness. No seizure activity. She reports  some headaches lately. PSYCHIATRY: No anxiety or depression other than when she stops the alcohol. ENDOCRINE: No polyuria or polydipsia. No heat or cold intolerance.   PAST MEDICAL HISTORY:  1. History of systemic hypertension.  2. History of chronic obstructive pulmonary disease.  3. Tobacco abuse.  4. Recent finding of abnormal mammogram under investigation.   PAST SURGICAL HISTORY: History of two back surgeries.   FAMILY HISTORY: Her brother suffers from lung cancer. She had an aunt suffers from liver cirrhosis. Both of her parents were alcoholics.   SOCIAL HISTORY: She is married, living with her husband. Currently retired.   SOCIAL HABITS: She is a chronic smoker; used to smoke 2 packs a day since age 86 and she quit two months ago. She drinks alcohol daily, about one-fifth of bourbon for many years. The last detoxification was 12 years ago.   ADMISSION MEDICATIONS:  1. Prilosec 40 mg once a day.  2. Spiriva 1 inhalation once a day. 3. Albuterol p.r.n.  4. Advair 250/50, 1 inhalation twice a day.  5. Vitamin B12 250 mcg once a day orally.  6. Norvasc 10 mg a day. 7. Folic acid and multivitamin once a day. 8. Ativan 1 mg p.r.n.  9. She was also recently started on Adderall XR 10 mg a day.   ALLERGIES: No known drug allergies. Reports side effects from Demerol and codeine causing nausea and GI upset.   PHYSICAL EXAMINATION:  VITAL SIGNS: Blood pressure 144/88, respiratory rate 24, pulse 122, temperature 98.4, oxygen saturation 95%.   GENERAL APPEARANCE: Elderly pleasant lady lying in bed in no acute distress.   HEAD AND NECK EXAMINATION: No pallor. No icterus. No cyanosis.   ENT: Hearing was normal.  Nasal mucosa, lips, tongue were normal.   EYES: Normal eyelids and conjunctiva. Pupils about 4 mm, equal and reactive to light.   NECK: Supple. Trachea at midline. No thyromegaly. No cervical masses.   HEART: Normal S1, S2. No S3, S4. No murmur. No gallop. No carotid bruits.    RESPIRATORY: Normal breathing pattern without use of accessory muscles. No rales. There are scattered rhonchi and few wheezing bilaterally and slight prolongation of the expiratory phase.   ABDOMEN: Soft, distended, tympanic by percussion. No hepatosplenomegaly. No masses. No hernias.   SKIN: No ulcers. No subcutaneous nodules.   MUSCULOSKELETAL: No joint swelling. No clubbing.   NEUROLOGIC: Cranial nerves II through XII are intact. No focal motor deficit.   PSYCHIATRIC: Patient is alert, oriented x3. Mood and affect were normal.   LABORATORY, DIAGNOSTIC AND RADIOLOGICAL DATA: CAT scan of the chest showed development of nodules in both lungs, the largest rounded nodule lie in the right lower lobe with the lateral aspect of the major fissure on the left. There is an area of patchy interstitial density with a small area of confluence in the right upper lobe. There are scattered 2 to 3 mm diameter nodules in both lungs. No mediastinal or hilar masses or lymphadenopathy. Although there are numerous normal sized lymph nodes present. There are emphysematous changes in both lungs without large bullous lesions. There are fatty infiltrative changes in the liver. Serum glucose 87, BUN 7, creatinine 0.7, sodium 137, potassium 3.4. Alcohol level 0.016, total protein 7.9, albumin 3.8, bilirubin 0.3, AST 129, ALT 70, TSH 1.7. Urinalysis unremarkable. CBC showed white count 5000, hemoglobin 12, hematocrit 38, platelet count 249. Urine drug screen was negative. Acetaminophen level less than 2. Salicylate less than 1.7.   ASSESSMENT:  1. Unresolving pneumonia.  2. Multiple pulmonary nodules suspicious for possible metastatic disease or primary lung disease with metastasis.  3. Recent finding of abnormal mammogram under investigation. 4. Alcohol dependence and abuse. She needs detoxification.  5. Systemic hypertension.  6. Chronic obstructive pulmonary disease.  7. Gastroesophageal reflux disease.   PLAN:  Patient will be admitted to the medical floor. I will start IV antibiotic using Rocephin and Zithromax. Pulmonary consultation with Dr. Meredeth IdeFleming who is familiar with her case. I will add DuoNebs and small dose of IV Solu-Medrol. Consult psychiatry for alcohol withdrawal and detoxification. Continue current medications as listed above. I spoke with the family, her husband and her son, regarding a LIVING WILL. Patient does have a LIVING WILL but they do not know the details. They will a copy of this will available in the morning. Right now she is FULL CODE.   TIME SPENT EVALUATING THIS PATIENT: More than 55 minutes. u   ____________________________ Carney CornersAmir M. Rudene Rearwish, MD amd:cms D: 08/08/2011 23:54:03 ET T: 08/09/2011 07:01:26 ET JOB#: [id number]  cc: Carney CornersAmir M. Rudene Rearwish, MD, <Dictator> Marya AmslerMarshall W. Dareen PianoAnderson, MD  Carney CornersAMIR M Jabree Pernice MD ELECTRONICALLY SIGNED 08/09/2011 22:31

## 2014-07-17 NOTE — Consult Note (Signed)
History of Present Illness:   Reason for Consult Pulmonary nodules, suspicious for metastatic disease.    HPI   Patient is a 74 year old female who was admitted to the hospital with recurring pneumonia as well as alcohol withdrawal.  Subsequent CT of the chest revealed multiple bilateral pulmonary nodules.  Currently patient has cough and symptoms of alcohol tall, but otherwise feels well.  She has no neurologic complaints.  She denies any recent fevers.  She denies any weight loss.  She has good appetite and denies any nausea, vomiting, constipation, or diarrhea.  She has no melena or hematochezia.  She has no urinary complaints.  Patient otherwise feels well and offers no further specific complaints.  PFSH:   Additional Past Medical and Surgical History Past medical history: Hypertension, COPD.    Past surgical history: Back surgery x2, benign breast cyst greater than 20 years ago.  Family history: Brother with lung cancer, aunt with cirrhosis.  Social history: Positive tobacco, 2 packs a day since the age of 71 quit approximately 2 months ago.  Heavy, daily alcohol use.   Review of Systems:   Performance Status (ECOG) 1    Review of Systems   As per HPI. Otherwise, 10 point system review was negative.   NURSING NOTES: ED Vital Sign Flow Sheet:   17-May-13 01:31    Temp Temperature: 98.4    Pulse Pulse: 111    Respirations Respirations: 22    SBP SBP: 147    DBP DBP: 70    Pulse Ox % Pulse Ox %: 94    Source: Room Air    Pain Scale (0-10) Pain Scale (0-10): Scale:3    Telemetry pattern Cardiac Rhythm: Sinus tachycardia    Cardiac Monitor On: No  NURSING NOTES: **Vital Signs.:   17-May-13 05:25    Vital Signs Type: Routine    Temperature Temperature (F): 98.2    Celsius: 36.7    Temperature Source: oral    Pulse Pulse: 92    Pulse source: per Dinamap    Respirations Respirations: 22    Systolic BP Systolic BP: 387    Diastolic BP (mmHg) Diastolic  BP (mmHg): 83    Mean BP: 105    BP Source: Dinamap    Pulse Ox % Pulse Ox %: 98    Pulse Ox Activity Level: At rest    Oxygen Delivery: 2L   Physical Exam:   Physical Exam General: Well-developed, well-nourished, no acute distress. Eyes: Pink conjunctiva, anicteric sclera. HEENT: Normocephalic, moist mucous membranes, clear oropharnyx. Lungs: Clear to auscultation bilaterally. Heart: Regular rate and rhythm. No rubs, murmurs, or gallops. Abdomen: Soft, nontender, nondistended. No organomegaly noted, normoactive bowel sounds. Musculoskeletal: No edema, cyanosis, or clubbing. Neuro: Alert, answering all questions appropriately. Cranial nerves grossly intact. Skin: No rashes or petechiae noted. Psych: Normal affect. Lymphatics: No cervical, calvicular, axillary or inguinal LAD.    Demerol: GI Distress  Tape: Rash  Codeine: GI Distress    Advair Diskus 250 mcg-50 mcg inhalation powder: 1 puff(s) inhaled 2 times a day, Active, 0, None   Spiriva 18 mcg inhalation capsule: 1 each inhaled once a day, Active, 0, None   albuterol CFC free 90 mcg/inh inhalation aerosol: 2 puff(s) inhaled 4 times a day, As Needed- for Shortness of Breath , Active, 0, None   Norvasc 10 mg oral tablet: 1 tab(s) orally once a day, Active, 0, None   Adderall XR 10 mg oral capsule, extended release: 1 cap(s) orally once a day (  in the morning), Active, 0, None   Prilosec 40 mg oral delayed release capsule: 1 cap(s) orally once a day, Active, 0, None   Ativan 1 mg oral tablet: 1 tab(s) orally 3 times a day, As Needed- for Anxiety, Nervousness , Active, 0, None   folic acid 1 mg oral tablet: 1 tab(s) orally once a day, Active, 0, None   Vitamin B-12 250 mcg oral tablet: 1 tab(s) orally once a day, Active, 0, None   multivitamin with minerals: 1 tab(s) orally once a day, Active, 0, None   Zoloft 50 mg oral tablet: 1 tab(s) orally once a day, Active, 0, None  Routine UA:  16-May-13 17:19    Color  (UA) Yellow   Clarity (UA) Clear   Glucose (UA) Negative   Bilirubin (UA) Negative   Ketones (UA) Negative   Specific Gravity (UA) S3   Blood (UA) Negative   pH (UA) 6.0   Protein (UA) 30 mg/dL   Nitrite (UA) Negative   Leukocyte Esterase (UA) Negative   RBC (UA) 1 /HPF   WBC (UA) 2 /HPF   Epithelial Cells (UA) 3 /HPF  General Ref:  16-May-13 17:19    Acetaminophen, Serum < 2.0  Routine Chem:  16-May-13 17:19    Glucose, Serum 87   BUN 7   Creatinine (comp) 0.70   Sodium, Serum 137   Potassium, Serum 3.4   Chloride, Serum 97   CO2, Serum 27   Calcium (Total), Serum 8.3  Hepatic:  16-May-13 17:19    Bilirubin, Total 0.3   Alkaline Phosphatase 89   SGPT (ALT) 70   SGOT (AST) 129   Total Protein, Serum 7.9   Albumin, Serum 3.8  Routine Chem:  16-May-13 17:19    Osmolality (calc) 271   eGFR (African American) >60   eGFR (Non-African American) >60   Anion Gap 13   Ethanol, S. 167   Ethanol % (comp) 0.167  Routine Hem:  16-May-13 17:19    WBC (CBC) 5.3   RBC (CBC) 3.55   Hemoglobin (CBC) 12.9   Hematocrit (CBC) 38.1   Platelet Count (CBC) 249   MCV 107   MCH 36.3   MCHC 33.9   RDW 14.6  General Ref:  45-OPF-29 24:46    Salicylates, Serum < 1.7  Thyroid:  16-May-13 17:19    Thyroid Stimulating Hormone 1.79  Urine Drugs:  28-MNO-17 71:16    Tricyclic Antidepressant, Ur Qual (comp) NEGATIVE   Amphetamines, Urine Qual. NEGATIVE   MDMA, Urine Qual. NEGATIVE   Cocaine Metabolite, Urine Qual. NEGATIVE   Opiate, Urine qual NEGATIVE   Phencyclidine, Urine Qual. NEGATIVE   Cannabinoid, Urine Qual. NEGATIVE   Barbiturates, Urine Qual. NEGATIVE   Benzodiazepine, Urine Qual. NEGATIVE   Methadone, Urine Qual. NEGATIVE   Assessment and Plan:  Impression:   Pulmonary nodules, suspicious for metastatic disease.  Plan:   1.  Pulmonary nodules: Will get a PET scan for further evaluation and assessment of a primary.  Patient also noted to have an abnormal mammogram  earlier this week that was reported as BI-RADS 4.  This is being followed by Dr. Tamala Julian and will likely require a biopsy in the near future.  Will also order tumor markers for further evaluation.   consult, will follow.    Electronic Signatures: Delight Hoh (MD)  (Signed 17-May-13 12:54)  Authored: HISTORY OF PRESENT ILLNESS, PFSH, ROS, NURSING NOTES, PE, ALLERGIES, HOME MEDICATIONS, LABS, ASSESSMENT AND PLAN   Last Updated: 17-May-13 12:54  by Delight Hoh (MD)

## 2014-07-17 NOTE — Discharge Summary (Signed)
PATIENT NAME:  Doris Blanchard, Doris Blanchard MR#:  [id number] DATE OF BIRTH:  05-28-1940  DATE OF ADMISSION:  08/09/2011 DATE OF DISCHARGE:  08/13/2011  DISCHARGE DIAGNOSES:  1. Alcohol detox.  2. Pneumonia.  3. Breast mass.  4. Alcoholic hepatitis.   DISCHARGE MEDICATIONS:  1. Advair 250/50, 1 puff b.i.d. 2. Spiriva 1 puff daily.  3. Albuterol p.r.n.  4. Norvasc 10 mg daily.  5. Adderall XR 10 mg daily.  6. Prilosec 40 mg daily.  7. Ativan 1 mg p.r.n.  8. Levaquin 500 mg daily for five days.  9. Prednisone taper 40 for five days, then 20 for five days. 10. Zoloft 100 mg daily.  11. Naltrexone 50 mg daily for alcohol craving suppression.   HISTORY AND PHYSICAL: Please see detailed history and physical done on admission.   HOSPITAL COURSE: patient was admitted, given IV antibiotics, had further work-up of pulmonary masses which are negative on PET, full-body was negative. Recent abnormal mammogram noted for which biopsy is recommended as outpatient. She noted some abdominal swelling but she had no ascites. No abnormalities of the liver seen. No metastatic disease seen. CBC remained stable as did MET-B. She had elevated AST, ALT on liver A panel slightly decreased albumin. No evidence of bleeding. She tolerated non-drinking well. Some Ativan p.r.n. given. Naltrexone risks, benefits was discussed; she wished to try it. She will be set up for outpatient detox program. Follow up with me and Dr. Raul Del as well as Dr. Tamala Julian for breast biopsy soon.    TIME SPENT: Took approximately 35 minutes to do discharge tasks today.   ____________________________ Ocie Cornfield. Ouida Sills, MD mwa:cms D: 08/13/2011 11:56:06 ET T: 08/13/2011 12:56:58 ET JOB#: [id number]  cc: Ocie Cornfield. Ouida Sills, MD, <Dictator> Kirk Ruths MD ELECTRONICALLY SIGNED 08/13/2011 16:20
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
